# Patient Record
Sex: Male | Born: 1990 | Hispanic: Yes | Marital: Single | Smoking: Never smoker
Health system: Southern US, Community
[De-identification: ages and names within clinical notes are randomized; demographics above are authoritative.]

## PROBLEM LIST (undated history)

## (undated) DIAGNOSIS — U071 COVID-19: Secondary | ICD-10-CM

## (undated) HISTORY — DX: COVID-19: U07.1

---
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
## 2019-01-28 ENCOUNTER — Other Ambulatory Visit: Payer: Self-pay

## 2019-01-28 DIAGNOSIS — Z20822 Contact with and (suspected) exposure to covid-19: Secondary | ICD-10-CM

## 2019-01-30 LAB — NOVEL CORONAVIRUS, NAA: SARS-CoV-2, NAA: NOT DETECTED

## 2019-03-26 ENCOUNTER — Encounter: Payer: Self-pay | Admitting: Urology

## 2019-03-26 ENCOUNTER — Other Ambulatory Visit: Payer: Self-pay

## 2019-03-26 ENCOUNTER — Ambulatory Visit (INDEPENDENT_AMBULATORY_CARE_PROVIDER_SITE_OTHER): Payer: Self-pay | Admitting: Urology

## 2019-03-26 VITALS — BP 125/80 | HR 76 | Ht 64.0 in | Wt 165.0 lb

## 2019-03-26 DIAGNOSIS — N411 Chronic prostatitis: Secondary | ICD-10-CM

## 2019-03-26 DIAGNOSIS — R1084 Generalized abdominal pain: Secondary | ICD-10-CM

## 2019-03-26 DIAGNOSIS — N419 Inflammatory disease of prostate, unspecified: Secondary | ICD-10-CM

## 2019-03-26 DIAGNOSIS — G894 Chronic pain syndrome: Secondary | ICD-10-CM

## 2019-03-26 LAB — URINALYSIS, COMPLETE
Bilirubin, UA: NEGATIVE
Glucose, UA: NEGATIVE
Ketones, UA: NEGATIVE
Leukocytes,UA: NEGATIVE
Nitrite, UA: NEGATIVE
RBC, UA: NEGATIVE
Specific Gravity, UA: 1.025 (ref 1.005–1.030)
Urobilinogen, Ur: 2 mg/dL — ABNORMAL HIGH (ref 0.2–1.0)
pH, UA: 6.5 (ref 5.0–7.5)

## 2019-03-26 LAB — MICROSCOPIC EXAMINATION
Bacteria, UA: NONE SEEN
RBC, Urine: NONE SEEN /hpf (ref 0–2)

## 2019-03-26 LAB — BLADDER SCAN AMB NON-IMAGING: Scan Result: 0

## 2019-03-28 ENCOUNTER — Encounter: Payer: Self-pay | Admitting: Urology

## 2019-03-28 NOTE — Progress Notes (Signed)
03/26/2019 7:12 AM   Rhona Leavens Hernandez [birth year] [id number]  Referring provider: Lorn Junes, FNP [address] [place],  [place] [address]  Chief Complaint  Patient presents with  . Prostatitis    HPI: Preston Mcgee is a 29 y.o. male seen in consultation at the request of Lorenda Cahill, FNP for chronic pelvic/abdominal pain and lower urinary tract symptoms.  He complains of an approximately 1 year history of generalized abdominal discomfort and pelvic pain.  He has low back pain and complains of urinary frequency, urgency and decreased stream.  He has radiation of the pain to his groin and down the inner aspects of his thighs bilaterally.  He also complains of difficulty achieving and maintaining erection and discoloration of his semen but not hematospermia.  He states he has seen 4 different providers and his symptoms have not improved despite treatment.  He complains of significant frustration.  He did see Dr. Evelene Croon and was treated with a medication though he does not recall the specific name.  He has been treated with Cipro at least twice the last a 30-day course without improvement in his symptoms.  IPSS completed today was 6/35 with a quality of life rated 6/6.   PMH: History reviewed. No pertinent past medical history.  Surgical History: History reviewed. No pertinent surgical history.  Home Medications:  Allergies as of 03/26/2019   No Known Allergies     Medication List    as of March 26, 2019 11:59 PM   You have not been prescribed any medications.     Allergies: No Known Allergies  Family History: History reviewed. No pertinent family history.  Social History:  reports that he has never smoked. He has never used smokeless tobacco. He reports current alcohol use. He reports that he does not use drugs.  ROS: UROLOGY Frequent Urination?: Yes Hard to postpone urination?: Yes Burning/pain with urination?: No Get up at night to  urinate?: No Leakage of urine?: No Urine stream starts and stops?: No Trouble starting stream?: Yes Do you have to strain to urinate?: Yes Blood in urine?: No Urinary tract infection?: No Sexually transmitted disease?: No Injury to kidneys or bladder?: No Painful intercourse?: Yes Weak stream?: No Erection problems?: Yes Penile pain?: Yes  Gastrointestinal Nausea?: No Vomiting?: No Indigestion/heartburn?: Yes Diarrhea?: No Constipation?: Yes  Constitutional Fever: No Night sweats?: No Weight loss?: No Fatigue?: No  Skin Skin rash/lesions?: No Itching?: No  Eyes Blurred vision?: No Double vision?: No  Ears/Nose/Throat Sore throat?: No Sinus problems?: No  Hematologic/Lymphatic Swollen glands?: No Easy bruising?: No  Cardiovascular Leg swelling?: No Chest pain?: No  Respiratory Cough?: No Shortness of breath?: No  Endocrine Excessive thirst?: No  Musculoskeletal Back pain?: Yes Joint pain?: No  Neurological Headaches?: No Dizziness?: No  Psychologic Depression?: No Anxiety?: No  Physical Exam: BP 125/80   Pulse 76   Ht 5\' 4"  (1.626 m)   Wt 165 lb (74.8 kg)   BMI 28.32 kg/m   Constitutional:  Alert and oriented, No acute distress. HEENT: [place] AT, moist mucus membranes.  Trachea midline, no masses. Cardiovascular: No clubbing, cyanosis, or edema. Respiratory: Normal respiratory effort, no increased work of breathing. GI: Abdomen is soft, nontender, nondistended, no abdominal masses GU: GU phallus without lesions, testes descended bilaterally without masses or tenderness.  Prostate 25 g with marked tenderness and significant tenderness of the pelvic floor. Skin: No rashes, bruises or suspicious lesions. Neurologic: Grossly intact, no focal deficits, moving all 4 extremities.  Psychiatric: Normal mood and affect.  Laboratory Data:  Urinalysis Dipstick/microscopy negative   Assessment & Plan:   29 y.o. male with chronic pelvic, abdominal  and back pain.  He complains of the ED and bothersome lower urinary tract symptoms.  Significant prostatic and pelvic floor tenderness on exam.  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  His urinalysis have been negative and would avoid any further treatment with antibiotics as the etiology of this condition is not infectious.  I had a frank discussion with him that the etiology is unknown therefore treatment can be difficult.  Due to the chronicity and lack of improvement in his symptoms over the last 12 months I have recommended further evaluation with a CT of the abdomen pelvis and cystoscopy.  We will have our staff contact PCP office to see if he has been tried on tamsulosin and will also request his records from Dr. Yves Dill.  A [place] interpreter was utilized during this visit.    Abbie Sons, [place] [address] [place], [place] [address] [phone]

## 2019-04-01 ENCOUNTER — Telehealth: Payer: Self-pay | Admitting: Urology

## 2019-04-01 NOTE — Telephone Encounter (Signed)
Patient called and he  ask if he could get a women pregnant with his issue. I advised him, yes. he could get a women pregnant .

## 2019-04-01 NOTE — Telephone Encounter (Signed)
Pt called asking to speak to Dr. Lonna Cobb, states he has a question for him.  Pt refused to give details (multiple attempts) says it is personal. Please advise pt at [phone].

## 2019-04-16 ENCOUNTER — Other Ambulatory Visit: Payer: Self-pay

## 2019-04-16 ENCOUNTER — Encounter: Payer: Self-pay | Admitting: Urology

## 2019-04-16 ENCOUNTER — Ambulatory Visit (INDEPENDENT_AMBULATORY_CARE_PROVIDER_SITE_OTHER): Payer: Self-pay | Admitting: Urology

## 2019-04-16 VITALS — BP 123/78 | HR 69 | Ht 64.0 in | Wt 170.0 lb

## 2019-04-16 DIAGNOSIS — R1084 Generalized abdominal pain: Secondary | ICD-10-CM

## 2019-04-16 DIAGNOSIS — N411 Chronic prostatitis: Secondary | ICD-10-CM

## 2019-04-16 DIAGNOSIS — G894 Chronic pain syndrome: Secondary | ICD-10-CM

## 2019-04-16 LAB — MICROSCOPIC EXAMINATION
Bacteria, UA: NONE SEEN
RBC, Urine: NONE SEEN /hpf (ref 0–2)

## 2019-04-16 LAB — URINALYSIS, COMPLETE
Bilirubin, UA: NEGATIVE
Glucose, UA: NEGATIVE
Ketones, UA: NEGATIVE
Leukocytes,UA: NEGATIVE
Nitrite, UA: NEGATIVE
Protein,UA: NEGATIVE
RBC, UA: NEGATIVE
Specific Gravity, UA: >1.03 — ABNORMAL HIGH (ref 1.005–1.030)
Urobilinogen, Ur: 0.2 mg/dL (ref 0.2–1.0)
pH, UA: 5 (ref 5.0–7.5)

## 2019-04-16 MED ORDER — AMITRIPTYLINE HCL 25 MG PO TABS: 25.0000 mg | ORAL_TABLET | Freq: Every day | ORAL | 0 refills | Status: DC

## 2019-04-16 NOTE — Progress Notes (Signed)
Yes ma'am  04/16/19  CC:  Chief Complaint  Patient presents with  . Cysto    HPI: Refer to my previous 03/26/2019.  CT has not been scheduled.  Blood pressure 123/78, pulse 69, height 5\' 4"  (1.626 m), weight 170 lb (77.1 kg).   Cystoscopy Procedure Note  Patient identification was confirmed, informed consent was obtained, and patient was prepped using Betadine solution.  Lidocaine jelly was administered per urethral meatus.     Pre-Procedure: - Inspection reveals a normal caliber ureteral meatus.  Procedure: The flexible cystoscope was introduced without difficulty - No urethral strictures/lesions are present. - Nonocclusive prostate  - Normal bladder neck - Bilateral ureteral orifices identified - Bladder mucosa  reveals no ulcers, tumors, or lesions - No bladder stones - No trabeculation  Retroflexion shows no abnormalities   Post-Procedure: - Patient tolerated the procedure well  Assessment/ Plan: -No evidence urethral stricture. -Will check on status of CT -Trial amitriptyline 25 mg at bedtime   , MD

## 2019-04-18 ENCOUNTER — Encounter: Payer: Self-pay | Admitting: Urology

## 2019-04-30 ENCOUNTER — Other Ambulatory Visit: Payer: Self-pay

## 2019-04-30 ENCOUNTER — Ambulatory Visit
Admission: RE | Admit: 2019-04-30 | Discharge: 2019-04-30 | Disposition: A | Discharge status: Home / Self Care | Payer: Self-pay | Source: Ambulatory Visit | Attending: Urology | Admitting: Urology

## 2019-04-30 DIAGNOSIS — R1084 Generalized abdominal pain: Secondary | ICD-10-CM | POA: Insufficient documentation

## 2019-04-30 DIAGNOSIS — G894 Chronic pain syndrome: Secondary | ICD-10-CM | POA: Insufficient documentation

## 2019-04-30 DIAGNOSIS — N411 Chronic prostatitis: Secondary | ICD-10-CM | POA: Insufficient documentation

## 2019-04-30 MED ORDER — IOHEXOL 300 MG/ML  SOLN
100.0000 mL | Freq: Once | INTRAMUSCULAR | Status: AC | PRN
  Administered 2019-04-30: 100 mL via INTRAVENOUS

## 2019-05-05 ENCOUNTER — Telehealth: Payer: Self-pay | Admitting: *Deleted

## 2019-05-05 NOTE — Telephone Encounter (Signed)
-----   Message from Riki Altes, MD sent at 05/04/2019  1:23 PM EST ----- CT showed no abnormalities.  If amitriptyline is not effective would recommend physical therapy referral for pelvic floor therapy

## 2019-05-06 ENCOUNTER — Encounter: Payer: Self-pay | Admitting: *Deleted

## 2019-05-06 NOTE — Telephone Encounter (Signed)
Sent a my chart message . No vocal mail on phone

## 2019-05-06 NOTE — Telephone Encounter (Signed)
Patient notified, he states he will call back if he wishes to see pelvic floor therapy

## 2019-11-18 ENCOUNTER — Telehealth: Payer: Self-pay | Admitting: Urology

## 2019-11-18 DIAGNOSIS — R102 Pelvic and perineal pain: Secondary | ICD-10-CM

## 2019-11-18 NOTE — Telephone Encounter (Signed)
The last chart entry indicated he would call back if he desired to pursue.  I placed the order

## 2019-11-18 NOTE — Telephone Encounter (Signed)
Patient was calling asking about his referral to PT. I did not see a referral in for one

## 2019-11-19 NOTE — Telephone Encounter (Signed)
OK thank you 

## 2020-01-13 ENCOUNTER — Ambulatory Visit: Payer: 59 | Attending: Urology | Admitting: Physical Therapy

## 2020-01-13 ENCOUNTER — Other Ambulatory Visit: Payer: Self-pay

## 2020-01-13 ENCOUNTER — Encounter: Payer: Self-pay | Admitting: Physical Therapy

## 2020-01-13 DIAGNOSIS — M6208 Separation of muscle (nontraumatic), other site: Secondary | ICD-10-CM | POA: Diagnosis present

## 2020-01-13 DIAGNOSIS — M6281 Muscle weakness (generalized): Secondary | ICD-10-CM | POA: Diagnosis present

## 2020-01-13 DIAGNOSIS — M533 Sacrococcygeal disorders, not elsewhere classified: Secondary | ICD-10-CM | POA: Insufficient documentation

## 2020-01-13 DIAGNOSIS — M791 Myalgia, unspecified site: Secondary | ICD-10-CM | POA: Insufficient documentation

## 2020-01-13 NOTE — Patient Instructions (Addendum)
    Avoid straining pelvic floor, abdominal muscles , spine  Use log rolling technique instead of getting out of bed with your neck or the sit-up     Log rolling into and out of bed   Log rolling into and out of bed If getting out of bed on R side, Bent knees, scoot hips/ shoulder to L  Raise R arm completely overhead, rolling onto armpit  Then lower bent knees to bed to get into complete side lying position  Then drop legs off bed, and push up onto R elbow/forearm, and use L hand to push onto the bed   __  Avoid slumped / leaning to one hip when driving   Sit with feet on the ground  ___

## 2020-01-14 NOTE — Therapy (Addendum)
[facility] MAIN [facility] SERVICES [address] [place], [place], [address] Phone: [phone]   Fax:  [phone]  Physical Therapy Evaluation  Patient Details  Name: Preston Mcgee MRN: [id number] Date of Birth: 04-22-1990 Referring Provider (PT): Stoiff    Encounter Date: 01/13/2020    History reviewed. No pertinent past medical history.  No past surgical history on file.  There were no vitals filed for this visit.    Subjective Assessment - 01/13/20 1510    Subjective 1) Pelvic and LBP pain: Pt reports this Sx started 2 years ago at the R testicle. Eventually the pain went to both testicle, scrotum, and down inside of legs to knees. Pt was prescribed pills prostatitis but they did not help.  Six months ago the first visit ( 1.5 years ago), pt went to health department because the pain increased and moving to low back area and kidney area.  Currently the LBP is limiting bending forward, bowel movements. Pain has limited pt from performing his physical workout. Testicular pain occurs with LBP sometimes with bending forward. Overall the LBP   8-9/10.  Testicular pain 10/10. R is tender to touch.      2)  Painful ejaculation with foul smell of ejaculation and difficulty with erection. Pt spoke with the doctor and his prostate was checked out. Cystoscope findings were neg.     3) painful urination and with bowel movement. Daily fluid intake: 1 gallon of water, Pepsi  1 can per week, denied tea and coffee no alcohol.  Frequency of BM every other day over the past 2 years. Prior to 2 years ago, pt had daily BMs. Pt also started to have to push.  Type 4 25%, Stool Type 3  75% of the time.   4) bloating started across the past 2 years.      Pertinent History Past physical workout: run, jog, soccer, performed sit ups/ crunches. 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     Patient Stated Goals address LBP, erection/ ejaculation issues, and having bowel movements / urination without pain              [facility] PT Assessment - 01/26/20 0925      Assessment   Medical Diagnosis Pelvic dysfunction    Referring Provider (PT) Stoiff       Precautions   Precautions None      Restrictions   Weight Bearing Restrictions No      Balance Screen   Has the patient fallen in the past 6 months No      Palpation   Spinal mobility L sidebend ~10% limited by pain, R rotation/ sideflexion no pain ( post Tx: improved AROM ~40%)      SI assessment  L iliac crest/ patella  higher, ( post Tx: levelled iliac crest)      Palpation comment palpation at pubic symphysis with referred pain to low back  ( post Tx: no pain with palpation at pubic symphysis)                 Pelvic Floor Special Questions - 01/26/20 0939    Diastasis Recti 3 fingers width               [facility] Adult PT Treatment/Exercise - 01/26/20 0925      Bed Mobility   Bed Mobility --   sit -> long sit into bed with  pain      Posture/Postural Control   Posture Comments limited diaphragmatic breathing       Therapeutic Activites    Therapeutic Activities --   explained anatomy/ physiology, body mechanics     Neuro Re-ed    Neuro Re-ed Details  cued for HEP       Manual Therapy   Manual therapy comments long axis distraction, rotational mob at T/L junction                       PT Long Term Goals - 01/13/20 1529      PT LONG TERM GOAL #1   Title Pt will report decreased pain with urination and bowel movement by 50% in order to improve QOL  and ADLs    Time 4    Period Weeks    Status New    Target Date 02/10/20      PT LONG TERM GOAL #2   Title Pt will report improved stool consistency with Type 4 25% to > 75% of the time in order have less straining    Time 8    Period Weeks    Status New    Target Date 03/09/20      PT LONG TERM GOAL #3   Title Pt will decrease  FOTO score for Pelvic Pain  and PFDI Urinary from 67 pts to < 57 pts in order to improve pelvic function    Time 10    Period Weeks    Status New    Target Date 03/23/20      PT LONG TERM GOAL #4   Title Pt will demo proper body mechanics to minimize straining abdominopelvic regions and improved posture ( less slumped) and 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  in order to optimize IAP system    Time 6    Period Weeks    Status New    Target Date 02/23/20      PT LONG TERM GOAL #5   Title Pt will report decreased pain by 50% with ejaculation, urination, and with bowel movements in order to improve ADLs    Time 6    Period Weeks    Status New    Target Date 02/23/20      Additional Long Term Goals   Additional Long Term Goals --      PT LONG TERM GOAL #6   Title Pt will report less bloating sensation by 50% and demo proper techniques for modified ab strengthening exercises  in order to promote less strain at abdominopelvic region while performing fitness routines.    Time 4    Period Weeks    Status New    Target Date 02/09/20      PT LONG TERM GOAL #7   Title Pt will demo decreased abdominal separation from 3 fingers width to < 2 fingers width in order to improve IAP system/ postural support to progress to proper abdominal and fitness exercises    Time 8    Period Weeks    Status New    Target Date 03/08/20      PT LONG TERM GOAL #8   Title Pt will demo equal alignment of pelvic girdle across 2 sessions and no referred pain to LBP area with palpation at pubic symphysis across 2 sessions in order for pt to bend forward with less pain    Time 4    Period Weeks  Status New    Target Date 02/09/20                  Plan - 01/26/20 0973    Clinical Impression Statement  Pt is a  29  yo who presents with pelvic pain, painful ejaculation, urination, bowel movements, and bloating. These Sx impact his ADLs and QOL. Pt's musculoskeletal assessment  revealed abdominal separation, limited spinal /pelvic mobility, referred LBP with palpation at pubic symphysis,  dyscoordination and strength of pelvic floor mm, poor posture and body mechanics which places strain on the abdominal/pelvic floor mm. These are deficits that indicate an ineffective intraabdominal pressure system associated with increased risk for pt's Sx.   Pt has a Hx of performing heavy lifting at a previous job and currently has a sedentary job as Naval architect. Advised pt to avoid leaning onto one side of pelvis when driving in order to minimize pelvic obliquities. Pt also had a Hx of performing sit-ups/ crunches in the past. Advised pt to not perform sit-ups and crunches as these movement patterns lead to more downward forces on pelvic floor, negatively impacting abdominopelvic/spinal dysfunctions. Pt will benefit from proper coordination training and education on fitness and functional positions in order to yield greater outcomes.   Pt was provided education on etiology of Sx with anatomy, physiology explanation with images along with the benefits of customized pelvic PT Tx based on pt's medical conditions and musculoskeletal deficits.  Explained the physiology of deep core mm coordination and roles of pelvic floor function in urination, defecation, sexual function, and postural control with deep core mm system.   Following Tx today which pt tolerated without complaints, pt demo'd equal alignment of pelvic girdle,   increased spinal mobility, and no more referred pain to low back with palpation at pubic symphysis.Plan to initiate deep core coordination address diastasis recti at next session and perform pelvic floor assessment more in-dept at upcoming sessions.   Engineer, structural required.      Personal Factors and Comorbidities Fitness    Examination-Activity Limitations Squat;Lift;Bend;Toileting    Stability/Clinical Decision Making Evolving/Moderate complexity    Rehab Potential  Good    PT Frequency 1x / week    PT Duration Other (comment)   10   PT Treatment/Interventions Balance training;Therapeutic exercise;Neuromuscular re-education;Therapeutic activities;Moist Heat;Manual techniques;Taping;Patient/family education;Functional mobility training;Gait training;Dry needling    Consulted and Agree with Plan of Care Patient           Patient will benefit from skilled therapeutic intervention in order to improve the following deficits and impairments:  Decreased safety awareness, Decreased coordination, Decreased range of motion, Difficulty walking, Decreased endurance, Decreased balance, Decreased activity tolerance, Improper body mechanics, Increased muscle spasms, Postural dysfunction, Pain, Decreased mobility  Visit Diagnosis: Sacrococcygeal disorders, not elsewhere classified  Muscle weakness (generalized)  Diastasis recti  Myalgia     Problem List There are no problems to display for this patient.   Mariane Masters 01/26/2020, 9:50 AM  [place] [facility] MAIN [facility] SERVICES [address] [place], [place], [address] Phone: [phone]   Fax:  [phone]  Name: Darly Fails MRN: [id number] Date of Birth: 1991/01/25

## 2020-01-22 ENCOUNTER — Encounter: Payer: Self-pay | Admitting: Physical Therapy

## 2020-01-26 NOTE — Addendum Note (Signed)
Addended by: Mariane Masters on: 01/26/2020 09:52 AM   Modules accepted: Orders

## 2020-02-02 ENCOUNTER — Other Ambulatory Visit: Payer: Self-pay

## 2020-02-02 ENCOUNTER — Ambulatory Visit: Payer: 59 | Admitting: Physical Therapy

## 2020-02-02 DIAGNOSIS — M6281 Muscle weakness (generalized): Secondary | ICD-10-CM

## 2020-02-02 DIAGNOSIS — M533 Sacrococcygeal disorders, not elsewhere classified: Secondary | ICD-10-CM

## 2020-02-02 DIAGNOSIS — M6208 Separation of muscle (nontraumatic), other site: Secondary | ICD-10-CM

## 2020-02-02 DIAGNOSIS — M791 Myalgia, unspecified site: Secondary | ICD-10-CM

## 2020-02-02 NOTE — Therapy (Signed)
[facility] MAIN [facility] SERVICES [address] [place], [place], [address] Phone: [phone]   Fax:  [phone]  Physical Therapy Treatment  Patient Details  Name: Preston Mcgee MRN: [id number] Date of Birth: 02-17-1991 Referring Provider (PT): Stoiff    Encounter Date: 02/02/2020   PT End of Session - 02/02/20 0200    Visit Number 2    Number of Visits 10    Date for PT Re-Evaluation 03/25/20    PT Start Time 1408    PT Stop Time 1456    PT Time Calculation (min) 48 min           No past medical history on file.  No past surgical history on file.  There were no vitals filed for this visit.   Subjective Assessment - 02/02/20 1411    Subjective Pt  reported he felt better w ith no more LBP when standing  but the pain remained the same with urination and bowel movements. Daily fluid intake 1 gallon of tea, 1 cup tea    Patient is accompained by: Interpreter    Pertinent History Past physical workout: run, jog, soccer, performed sit ups/ crunches. 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    Patient Stated Goals address LBP, erection/ ejaculation issues, and having bowel movements / urination without pain              [facility] PT Assessment - 02/02/20 1416      Palpation   Spinal mobility tightness at paraspinal mm lumbar and thoracic     SI assessment  L iliac crest/ patella levelled     Palpation comment tenderness at pubic symphysis B, Tightness paraspinal B ,                        Pelvic Floor Special Questions - 02/02/20 1416    Diastasis Recti no separation     External Perineal Exam Through undergarments ,  tightenss/ tenderness at bulbospongiosus, perineal traverse, B               [facility] Adult PT Treatment/Exercise - 02/02/20 1517      Neuro Re-ed    Neuro Re-ed Details  cued for HEP       Manual Therapy   Manual therapy  comments L rotational mob to promote nutation of sacrum, paraspinals B, ischial tuberosity attachments B                          PT Long Term Goals - 01/13/20 1529      PT LONG TERM GOAL #1   Title Pt will report decreased pain with urination and bowel movement by 50% in order to improve QOL  and ADLs    Time 4    Period Weeks    Status New    Target Date 02/10/20      PT LONG TERM GOAL #2   Title Pt will report improved stool consistency with Type 4 25% to > 75% of the time in order have less straining    Time 8    Period Weeks    Status New    Target Date 03/09/20      PT LONG TERM GOAL #3   Title Pt will decrease FOTO score for Pelvic Pain  and PFDI Urinary from 67 pts to < 57 pts  in order to improve pelvic function    Time 10    Period Weeks    Status New    Target Date 03/23/20      PT LONG TERM GOAL #4   Title Pt will demo proper body mechanics to minimize straining abdominopelvic regions and improved posture ( less slumped) and 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  in order to optimize IAP system    Time 6    Period Weeks    Status New    Target Date 02/23/20      PT LONG TERM GOAL #5   Title Pt will report decreased pain by 50% with ejaculation, urination, and with bowel movements in order to improve ADLs    Time 6    Period Weeks    Status New    Target Date 02/23/20      Additional Long Term Goals   Additional Long Term Goals --      PT LONG TERM GOAL #6   Title Pt will report less bloating sensation by 50% and demo proper techniques for modified ab strengthening exercises  in order to promote less strain at abdominopelvic region while performing fitness routines.    Time 4    Period Weeks    Status New    Target Date 02/09/20      PT LONG TERM GOAL #7   Title Pt will demo decreased abdominal separation from 3 fingers width to < 2 fingers width in order to improve IAP system/ postural support to progress to proper  abdominal and fitness exercises    Time 8    Period Weeks    Status New    Target Date 03/08/20      PT LONG TERM GOAL #8   Title Pt will demo equal alignment of pelvic girdle across 2 sessions and no referred pain to LBP area with palpation at pubic symphysis across 2 sessions in order for pt to bend forward with less pain    Time 4    Period Weeks    Status New    Target Date 02/09/20                 Plan - 02/02/20 1502    Clinical Impression Statement Pt showed good carry over from last session with levelled pelvic girdle and resolved DRA. Pt reported LBP is better as he is able to stand for longer periods of time. External pelvic floor assessment showed tightness of pelvic floor mm and referred pain to LBP. Thus, addressed tightness of paraspinal mm and deferred pelvic floor HEP for next session. HEP today was focused decreasing paraspinal mm tightness. Provided education on modifying his gym routine in upcoming sessions so pt can return to the gym.  Explained the importance of flexibility to compliment strength training. Pt continues to benefit from skilled PT.    Personal Factors and Comorbidities Fitness    Examination-Activity Limitations Squat;Lift;Bend;Toileting    Stability/Clinical Decision Making Evolving/Moderate complexity    Rehab Potential Good    PT Frequency 1x / week    PT Duration Other (comment)   10   PT Treatment/Interventions Balance training;Therapeutic exercise;Neuromuscular re-education;Therapeutic activities;Moist Heat;Manual techniques;Taping;Patient/family education;Functional mobility training;Gait training;Dry needling    Consulted and Agree with Plan of Care Patient           Patient will benefit from skilled therapeutic intervention in order to improve the following deficits and impairments:  Decreased safety awareness, Decreased  coordination, Decreased range of motion, Difficulty walking, Decreased endurance, Decreased balance, Decreased  activity tolerance, Improper body mechanics, Increased muscle spasms, Postural dysfunction, Pain, Decreased mobility  Visit Diagnosis: Muscle weakness (generalized)  Diastasis recti  Myalgia  Sacrococcygeal disorders, not elsewhere classified     Problem List There are no problems to display for this patient.   Mariane Masters ,PT, DPT, E-RYT  02/02/2020, 3:21 PM  [place] [facility] MAIN [facility] SERVICES [address] [place], [place], [address] Phone: [phone]   Fax:  [phone]  Name: Preston Mcgee MRN: [id number] Date of Birth: Feb 15, 1991

## 2020-02-09 ENCOUNTER — Other Ambulatory Visit: Payer: Self-pay

## 2020-02-09 ENCOUNTER — Ambulatory Visit: Payer: 59 | Attending: Urology | Admitting: Physical Therapy

## 2020-02-09 DIAGNOSIS — M533 Sacrococcygeal disorders, not elsewhere classified: Secondary | ICD-10-CM | POA: Diagnosis present

## 2020-02-09 DIAGNOSIS — M6281 Muscle weakness (generalized): Secondary | ICD-10-CM | POA: Diagnosis not present

## 2020-02-09 DIAGNOSIS — M791 Myalgia, unspecified site: Secondary | ICD-10-CM

## 2020-02-09 DIAGNOSIS — M6208 Separation of muscle (nontraumatic), other site: Secondary | ICD-10-CM | POA: Diagnosis present

## 2020-02-09 NOTE — Patient Instructions (Addendum)
Minisquat,   interlace hands, rise up, chest lifts , shoulders squeeze together    5 reps  __  Stretches : (Cuing provided for proper alignment)   With strap under thigh   _ figure 4    _ cross thigh over     - scoot hip over to R, drop R knee over, L knee straight     -Quad in sidelying _strap around the ankle, pulling ankle towards buttocks

## 2020-02-09 NOTE — Therapy (Addendum)
[facility] MAIN [facility] SERVICES [address] [place], [place], [address] Phone: [phone]   Fax:  [phone]  Physical Therapy Treatment  Patient Details  Name: Preston Mcgee MRN: [id number] Date of Birth: 1990/10/16 Referring Provider (PT): Stoiff    Encounter Date: 02/09/2020   PT End of Session - 02/09/20 1420    Visit Number 3    Number of Visits 10    Date for PT Re-Evaluation 03/25/20    PT Start Time 1409    PT Stop Time 1505    PT Time Calculation (min) 56 min    Activity Tolerance No increased pain;Patient tolerated treatment well    Behavior During Therapy [facility] for tasks assessed/performed           No past medical history on file.  No past surgical history on file.  There were no vitals filed for this visit.   Subjective Assessment - 02/09/20 1412    Subjective Pt's  low back area feels lighter. Two weeks ago with use of stool to prop his legs up during BMs, pt was able to have BMs daily for one week. Last week, BMs occurred every other day.    Patient is accompained by: Interpreter    Pertinent History Past physical workout: run, jog, soccer, performed sit ups/ crunches. 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    Patient Stated Goals address LBP, erection/ ejaculation issues, and having bowel movements / urination without pain              [facility] PT Assessment - 02/09/20 1414      Coordination   Gross Motor Movements are Fluid and Coordinated --   chest breathing. upward movement of pelvic floor      Palpation   Spinal mobility sidebend/ rotation WFL, no pain     SI assessment  levelled iliac crest B     Palpation comment L FADDIR/ hip IR tighter than R, ITband/ thoracolumbar tightness                        Pelvic Floor Special Questions - 02/09/20 1423    External Perineal Exam 2nd -3rd layers tightness perineal  tranverse mm              [facility] Adult PT Treatment/Exercise - 02/09/20 1458      Neuro Re-ed    Neuro Re-ed Details  cued for technique for BLE / hip/ SIJ stretches and deep core 1 and 2    cued for less chest breathing      Exercises   Exercises --   see pt instructions,explained w/ anatomy pic,how helps goals     Manual Therapy   Manual therapy comments  long axis distraction at LLE, QL to promote FADDIR and SIJ mobility                        PT Long Term Goals - 01/13/20 1529      PT LONG TERM GOAL #1   Title Pt will report decreased pain with urination and bowel movement by 50% in order to improve QOL  and ADLs    Time 4    Period Weeks    Status New    Target Date 02/10/20      PT LONG TERM GOAL #2   Title Pt will report improved stool  consistency with Type 4 25% to > 75% of the time in order have less straining    Time 8    Period Weeks    Status New    Target Date 03/09/20      PT LONG TERM GOAL #3   Title Pt will decrease FOTO score for Pelvic Pain  and PFDI Urinary from 67 pts to < 57 pts in order to improve pelvic function    Time 10    Period Weeks    Status New    Target Date 03/23/20      PT LONG TERM GOAL #4   Title Pt will demo proper body mechanics to minimize straining abdominopelvic regions and improved posture ( less slumped) and 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  in order to optimize IAP system    Time 6    Period Weeks    Status New    Target Date 02/23/20      PT LONG TERM GOAL #5   Title Pt will report decreased pain by 50% with ejaculation, urination, and with bowel movements in order to improve ADLs    Time 6    Period Weeks    Status New    Target Date 02/23/20      Additional Long Term Goals   Additional Long Term Goals --      PT LONG TERM GOAL #6   Title Pt will report less bloating sensation by 50% and demo proper techniques for modified ab strengthening exercises  in order to promote less  strain at abdominopelvic region while performing fitness routines.    Time 4    Period Weeks    Status New    Target Date 02/09/20      PT LONG TERM GOAL #7   Title Pt will demo decreased abdominal separation from 3 fingers width to < 2 fingers width in order to improve IAP system/ postural support to progress to proper abdominal and fitness exercises    Time 8    Period Weeks    Status New    Target Date 03/08/20      PT LONG TERM GOAL #8   Title Pt will demo equal alignment of pelvic girdle across 2 sessions and no referred pain to LBP area with palpation at pubic symphysis across 2 sessions in order for pt to bend forward with less pain    Time 4    Period Weeks    Status New    Target Date 02/09/20                 Plan - 02/09/20 1421    Clinical Impression Statement Pt demo'd improved mobility at spine and has maintained levelled iliac crest alignment across the past sessions and resolved DRA which is helping with improved IAP system and consistent with improved LBP.  Further addressed SIJ/ hip tightness with manual Tx which helped to improve hip mobility.  Educated pt on hip stretches. Progressed to deep core exercises whic pt required minor cues for less chest breathing/ dyscoordination.  Pt demo'd less pelvic floor tenderness and tightness after hip stretches. Pt continues to benefit from skilled PT    Personal Factors and Comorbidities Fitness    Examination-Activity Limitations Squat;Lift;Bend;Toileting    Stability/Clinical Decision Making Evolving/Moderate complexity    Rehab Potential Good    PT Frequency 1x / week    PT Duration Other (comment)   10   PT Treatment/Interventions  Balance training;Therapeutic exercise;Neuromuscular re-education;Therapeutic activities;Moist Heat;Manual techniques;Taping;Patient/family education;Functional mobility training;Gait training;Dry needling    Consulted and Agree with Plan of Care Patient           Patient will benefit  from skilled therapeutic intervention in order to improve the following deficits and impairments:  Decreased safety awareness, Decreased coordination, Decreased range of motion, Difficulty walking, Decreased endurance, Decreased balance, Decreased activity tolerance, Improper body mechanics, Increased muscle spasms, Postural dysfunction, Pain, Decreased mobility  Visit Diagnosis: Muscle weakness (generalized)  Diastasis recti  Myalgia  Sacrococcygeal disorders, not elsewhere classified     Problem List There are no problems to display for this patient.   Mariane Masters ,PT, DPT, E-RYT  02/09/2020, 3:00 PM  [facility] MAIN [facility] SERVICES [address] [place], [place], [address] Phone: [phone]   Fax:  [phone]  Name: Preston Mcgee MRN: [id number] Date of Birth: [birth year]

## 2020-02-16 ENCOUNTER — Other Ambulatory Visit: Payer: Self-pay

## 2020-02-16 ENCOUNTER — Ambulatory Visit: Payer: 59 | Admitting: Physical Therapy

## 2020-02-16 DIAGNOSIS — M6208 Separation of muscle (nontraumatic), other site: Secondary | ICD-10-CM

## 2020-02-16 DIAGNOSIS — M533 Sacrococcygeal disorders, not elsewhere classified: Secondary | ICD-10-CM

## 2020-02-16 DIAGNOSIS — M6281 Muscle weakness (generalized): Secondary | ICD-10-CM

## 2020-02-16 DIAGNOSIS — M791 Myalgia, unspecified site: Secondary | ICD-10-CM

## 2020-02-16 NOTE — Therapy (Signed)
[facility] MAIN [facility] SERVICES [address] [place], [place], [address] Phone: [phone]   Fax:  [phone]  Physical Therapy Treatment  Patient Details  Name: Preston Mcgee MRN: [id number] Date of Birth: 1990/04/12 Referring Provider (PT): Stoiff    Encounter Date: 02/16/2020   PT End of Session - 02/16/20 1501    Visit Number 4    Number of Visits 10    Date for PT Re-Evaluation 03/25/20    PT Start Time 1405    PT Stop Time 1500    PT Time Calculation (min) 55 min    Activity Tolerance No increased pain;Patient tolerated treatment well    Behavior During Therapy [facility] for tasks assessed/performed           No past medical history on file.  No past surgical history on file.  There were no vitals filed for this visit.   Subjective Assessment - 02/16/20 1408    Subjective Pt is able to empty bowels more completely except for 2 x last week and still continue to have daily bowel movements. Pt is feeling pain in low back that radiates down the leg when he bends. While driving his trunk,  burning in low abdomen when delaying his urine by 30 min after feeling the urge to go.  After urinating the burning continues.    Patient is accompained by: Interpreter    Pertinent History Past physical workout: run, jog, soccer, performed sit ups/ crunches. 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    Patient Stated Goals address LBP, erection/ ejaculation issues, and having bowel movements / urination without pain              [facility] PT Assessment - 02/16/20 1451      Observation/Other Assessments   Observations decreased paraspinal mm      Squat   Comments locked knees      Palpation   Palpation comment hypomobile L SIJ./ sacral spine./ hip abduction system L                         [facility] Adult PT Treatment/Exercise - 02/16/20 1452       Therapeutic Activites    Other Therapeutic Activities explained anatomy/ phsyology and etiology to Sx, provided reassurance to improvement to his Sx, explained water intake and rtaking rest stops to urinate and not to delay urination,      Neuro Re-ed    Neuro Re-ed Details  cued for new stretches for hips/ pelvic floor. back when taking rest stps during long drives, cued for proper squats      Manual Therapy   Manual therapy comments long axis distraction, AP mob at L hip to promote mobility/ FADDIR / mm STM to promote mobility L SIJ                       PT Long Term Goals - 02/16/20 1530      PT LONG TERM GOAL #1   Title Pt will report decreased pain with urination and bowel movement by 50% in order to improve QOL  and ADLs    Time 4    Period Weeks    Status On-going      PT LONG TERM GOAL #2   Title Pt will report improved stool consistency with Type 4 25% to > 75% of the  time in order have less straining    Time 8    Period Weeks    Status On-going      PT LONG TERM GOAL #3   Title Pt will decrease FOTO score for Pelvic Pain  and PFDI Urinary from 67 pts to < 57 pts in order to improve pelvic function    Time 10    Period Weeks    Status On-going      PT LONG TERM GOAL #4   Title Pt will demo proper body mechanics to minimize straining abdominopelvic regions and improved posture ( less slumped) and 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    Time 4    Period Weeks    Status On-going      PT LONG TERM GOAL #7   Title Pt will demo decreased abdominal separation from 3 fingers width to < 2 fingers width in order to improve IAP system/ postural support to progress to proper abdominal and fitness exercises    Time 8    Period Weeks    Status On-going      PT LONG TERM GOAL #8   Title Pt will demo equal alignment of pelvic girdle across 2 sessions and no referred pain to LBP area with palpation at pubic symphysis across 2 sessions in order for pt to bend forward with less pain    Time 4    Period Weeks    Status On-going                 Plan - 02/16/20 1527    Clinical Impression Statement Pt continued to maintain equal pelvic girdle alignment and is showing less paraspinal mm tightness which is consistent with his report of improved bowel movements. Addressed his urinary Sx with education to not delay urination which is pattern he has as he is a [employer]. Advised water intake 2 hours before arriving rest stop to minimize delaying urination. Provided stretches to global hip mm and pelvic floor to perform while taking rest stops during his long drives.  Provided manual Tx to optimize L SIJ mobility which will help minimize radiating pain.  Pt continues to benefit from skilled PT.    Personal Factors and Comorbidities Fitness    Examination-Activity Limitations Squat;Lift;Bend;Toileting    Stability/Clinical Decision Making Evolving/Moderate complexity    Rehab Potential Good    PT Frequency 1x / week    PT Duration Other (comment)   10   PT Treatment/Interventions Balance training;Therapeutic exercise;Neuromuscular re-education;Therapeutic activities;Moist Heat;Manual techniques;Taping;Patient/family education;Functional mobility training;Gait training;Dry needling    Consulted and Agree with Plan of Care Patient           Patient will benefit from skilled therapeutic intervention in order to improve  the following deficits and impairments:  Decreased safety awareness,Decreased  coordination,Decreased range of motion,Difficulty walking,Decreased endurance,Decreased balance,Decreased activity tolerance,Improper body mechanics,Increased muscle spasms,Postural dysfunction,Pain,Decreased mobility  Visit Diagnosis: Muscle weakness (generalized)  Diastasis recti  Myalgia  Sacrococcygeal disorders, not elsewhere classified     Problem List There are no problems to display for this patient.   Mariane Masters ,PT, DPT, E-RYT   02/16/2020, 3:30 PM  [place] [facility] MAIN [facility] SERVICES [address] [place], [place], [address] Phone: [phone]   Fax:  [phone]  Name: Bran Aldridge MRN: [id number] Date of Birth: October 23, 1990

## 2020-02-16 NOTE — Patient Instructions (Signed)
Work stretches at rest stops to stretch  Hip flexor Adductors  gluts  Seated stretches ( twist and figure 4 )   __  anatomy p ictures ( copied ) to explain the role of nerves and relaxed pelvic floor muscles for pelvic functions)   __  water intake 2 hours before arriving rest stop  To minimize delaying urination

## 2020-02-23 ENCOUNTER — Ambulatory Visit: Payer: 59 | Admitting: Physical Therapy

## 2020-02-23 ENCOUNTER — Other Ambulatory Visit: Payer: Self-pay

## 2020-02-23 DIAGNOSIS — M533 Sacrococcygeal disorders, not elsewhere classified: Secondary | ICD-10-CM

## 2020-02-23 DIAGNOSIS — M6281 Muscle weakness (generalized): Secondary | ICD-10-CM

## 2020-02-23 DIAGNOSIS — M791 Myalgia, unspecified site: Secondary | ICD-10-CM

## 2020-02-23 DIAGNOSIS — M6208 Separation of muscle (nontraumatic), other site: Secondary | ICD-10-CM

## 2020-02-23 NOTE — Patient Instructions (Signed)
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

## 2020-02-23 NOTE — Therapy (Signed)
[facility] MAIN [facility] SERVICES [address] [place], [place], [address] Phone: [phone]   Fax:  [phone]  Physical Therapy Treatment  Patient Details  Name: Preston Mcgee MRN: [id number] Date of Birth: 1990/12/02 Referring Provider (PT): Stoiff    Encounter Date: 02/23/2020   PT End of Session - 02/23/20 1638    Visit Number 5    Number of Visits 10    Date for PT Re-Evaluation 03/25/20    PT Start Time 1403    PT Stop Time 1503    PT Time Calculation (min) 60 min    Activity Tolerance No increased pain;Patient tolerated treatment well    Behavior During Therapy [facility] for tasks assessed/performed           No past medical history on file.  No past surgical history on file.  There were no vitals filed for this visit.   Subjective Assessment - 02/23/20 1404    Subjective Pt reports no pelvic pain for one week. Pt has had no  radiating LBP pain last week except 2/10 pain.  Bowel movements occur daily. 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 Today , pt felt burning sensation at pubic area after drinking water and driving his truck. Today his urination smelled strange.  Pt has been treated for infections and he does not have any.  This is the original Sx he had when all these issues started.    Patient is accompained by: Interpreter    Pertinent History Past physical workout: run, jog, soccer, performed sit ups/ crunches. 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    Patient Stated Goals address LBP, erection/ ejaculation issues, and having bowel movements / urination without pain              [facility] PT Assessment - 02/23/20 1408      Observation/Other Assessments   Observations posterir tilt of pelvis in seating , required cues       Squat   Comments poor carry over, no trunk/ hip bend,      Posture/Postural Control   Posture Comments hyperextended knees , simulated truck sitting posture: leaning to R, L shoulder flexion on wheel , slumped sit      Palpation   Palpation comment pain above pubic symphysis R, deep palpation with R SLR ( post Tx, no pain)  fascial restrictions noted with pulling sensation reported                        [facility] Adult PT Treatment/Exercise - 02/23/20 1703      Neuro Re-ed    Neuro Re-ed Details  cued for proper squat ( 10 reps w/ kettlebell), sitting posture in truck to not lean, modify pull up without h ip flexion but to stand instead      Modalities   Modalities Moist Heat      Moist Heat Therapy   Number Minutes Moist Heat 5 Minutes    Moist Heat Location --   in prone w/ stretches     Manual Therapy   Manual therapy comments fascial releases over R LQ by pubic symphysis with lower trunk rotation           Therapeutic Activities : anatomy and  physiology explanations with images             PT Long Term Goals - 02/16/20 1530      PT LONG TERM GOAL #1   Title Pt will report decreased pain with urination and bowel movement by 50% in order to improve QOL  and ADLs    Time 4    Period Weeks    Status On-going      PT LONG TERM GOAL #2   Title Pt will report improved stool consistency with Type 4 25% to > 75% of the time in order have less straining    Time 8    Period Weeks    Status On-going      PT LONG TERM GOAL #3   Title Pt will decrease FOTO score for Pelvic Pain  and PFDI Urinary from 67 pts to < 57 pts in order to improve pelvic function    Time 10    Period Weeks    Status On-going      PT LONG TERM GOAL #4   Title Pt will demo proper body mechanics to minimize straining abdominopelvic regions and improved posture ( less slumped) and 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    Time 4    Period Weeks    Status On-going      PT LONG TERM GOAL #7   Title Pt will demo decreased abdominal separation from 3 fingers width to < 2 fingers width in order to improve IAP system/ postural support to progress to proper abdominal and fitness exercises    Time 8    Period Weeks    Status On-going      PT LONG TERM GOAL #8   Title Pt will demo equal alignment of pelvic girdle across 2 sessions and no referred pain to LBP area with palpation at pubic symphysis across 2 sessions in order for pt to bend forward with less pain    Time 4    Period Weeks    Status On-going                 Plan - 02/23/20 1736    Clinical Impression Statement Pt continues to make improvements with no pelvic pain and LBP last week. Pt also reported having to not delay urination by drinking 2 hours before he stops at rest stops on this truck route instead of drinking consistently.   Today, pt noticed while in his truck sitting after drinking water, he noticed a burning sensation at R LQ suprapubic area. Manual Tx helped to minimize fascial restrictions and pt reported no burning psensation afterwards with deep palpation at lateral border of bladder nor when engaging with R SLR. Required excessive cues for proper squat and avoiding hyperextension of knees to minimize SIJ hypomobility /  pelvic floor tightness. Required education on proper sitting position in truck to avoid R lean and shortening inguinal fascial area. Pt continues  to benefit from skilled PT.    Personal Factors and Comorbidities Fitness    Examination-Activity Limitations Squat;Lift;Bend;Toileting    Stability/Clinical Decision Making Evolving/Moderate complexity    Rehab Potential Good    PT Frequency 1x / week    PT Duration Other (comment)   10   PT Treatment/Interventions Balance training;Therapeutic exercise;Neuromuscular re-education;Therapeutic activities;Moist Heat;Manual techniques;Taping;Patient/family education;Functional mobility training;Gait training;Dry needling    Consulted and Agree with Plan of Care Patient           Patient will benefit from skilled therapeutic intervention in order to improve the following deficits and impairments:  Decreased safety awareness,Decreased coordination,Decreased range of motion,Difficulty walking,Decreased endurance,Decreased balance,Decreased activity tolerance,Improper body mechanics,Increased muscle spasms,Postural dysfunction,Pain,Decreased mobility  Visit Diagnosis: Muscle weakness (generalized)  Diastasis recti  Myalgia  Sacrococcygeal disorders, not elsewhere classified     Problem List There are no problems to display for this patient.   Mariane Masters  ,PT, DPT, E-RYT  02/23/2020, 5:36 PM  [place] [facility] MAIN [facility] SERVICES [address] [place], [place], [address] Phone: [phone]   Fax:  [phone]  Name: Preston Mcgee MRN: [id number] Date of Birth: [birth year]

## 2020-03-01 ENCOUNTER — Ambulatory Visit: Payer: 59 | Admitting: Physical Therapy

## 2020-03-01 ENCOUNTER — Other Ambulatory Visit: Payer: Self-pay

## 2020-03-01 DIAGNOSIS — M6281 Muscle weakness (generalized): Secondary | ICD-10-CM | POA: Diagnosis not present

## 2020-03-01 DIAGNOSIS — M6208 Separation of muscle (nontraumatic), other site: Secondary | ICD-10-CM

## 2020-03-01 DIAGNOSIS — M791 Myalgia, unspecified site: Secondary | ICD-10-CM

## 2020-03-01 DIAGNOSIS — M533 Sacrococcygeal disorders, not elsewhere classified: Secondary | ICD-10-CM

## 2020-03-01 NOTE — Patient Instructions (Addendum)
Add to work stretches after you arrive home     Quad stretch on your belly with pillow under hips   strap at the ankle     Resting on your belly with pillow under hips, 5 min

## 2020-03-01 NOTE — Therapy (Signed)
[place] MAIN [facility] SERVICES [address] [place], [place], [address] Phone: [phone]   Fax:  [phone]  Physical Therapy Treatment  Patient Details  Name: Preston Mcgee MRN: [id number] Date of Birth: 08/19/1990 Referring Provider (PT): Stoiff    Encounter Date: 03/01/2020   PT End of Session - 03/01/20 1457    Visit Number 6    Number of Visits 10    Date for PT Re-Evaluation 03/25/20    PT Start Time 2536    PT Stop Time 1500    PT Time Calculation (min) 56 min    Activity Tolerance No increased pain;Patient tolerated treatment well    Behavior During Therapy [facility] for tasks assessed/performed           No past medical history on file.  No past surgical history on file.  There were no vitals filed for this visit.   Subjective Assessment - 03/01/20 1408    Subjective Pt reports the day after last session, pt noticed pulsing at the R LQ abdomen which eased that night. He also noticed pain level 3/10 from R LQ to scrotum. This pain decreased to 0/10 after a few hours . Prior to last session, pt had an erection and he felt pain after the erection.  He has not had an erection since last session to compare if he had pain. Pt reports his sperm from few weeks ago appeared slimy and hard and then later, more transparent. Last year he got COVID ( Dec 2020)  and it is hard to detect whether the foul smell with his semen. The first time he noticed the foul smell of semen was this summer.    Patient is accompained by: Interpreter    Pertinent History Past physical workout: run, jog, soccer, performed sit ups/ crunches. 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    Patient Stated Goals address LBP, erection/ ejaculation issues, and having bowel movements / urination without pain              [facility] PT Assessment - 03/01/20 1558      Posture/Postural Control    Posture Comments standing with knees unlocked without cues      Palpation   Spinal mobility less paraspinal mm tightness    Palpation comment fascial restrictions over supra pubic  R > L, no pain at R LQ at depth similar to last session   post Tx: pt reported feeling pinpointed pain at R LQ with selfpalpation post Tx.                     Pelvic Floor Special Questions - 03/01/20 1500    External Perineal Exam through clothing: tightness at bulbospongiosus. ischicavernosus B , fascial restrictions on R suprapubic ( pt reported he used to lean on his R hip when driving his truck             [facility] Adult PT Treatment/Exercise - 03/01/20 1558      Therapeutic Activites    Other Therapeutic Activities explained anatomy and function of pelvic floor and positive encouragement for his progress, advised if he notices pulsing in R LQ of abdomen to come back, to seek medical attention immediately      Neuro Re-ed    Neuro Re-ed Details  cued anterior tilt of pelvis,      Modalities   Modalities --   moist  heat     Moist Heat Therapy   Number Minutes Moist Heat 5 Minutes    Moist Heat Location --   perineum     Manual Therapy   Manual therapy comments fascial releases over R LQ by pubic symphysis with lower trunk rotation   STM/MWM at pelvic floor anterior thru clothing                      PT Long Term Goals - 03/01/20 1429      PT LONG TERM GOAL #1   Title Pt will report decreased pain with urination and bowel movement by 50% in order to improve QOL  and ADLs  ( 12/27: no pain with urination and BM)    Time 4    Period Weeks    Status Achieved      PT LONG TERM GOAL #2   Title Pt will report improved stool consistency with Type 4 25% to > 75% of the time in order have less straining    Time 8    Period Weeks    Status Partially Met      PT LONG TERM GOAL #3   Title Pt will decrease FOTO score for Pelvic Pain  and PFDI Urinary from 67 pts to < 57 pts in  order to improve pelvic function    Time 10    Period Weeks    Status On-going      PT LONG TERM GOAL #4   Title Pt will demo proper body mechanics to minimize straining abdominopelvic regions and improved posture ( less slumped) and 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  in order to optimize IAP system    Time 6    Period Weeks    Status Achieved      PT LONG TERM GOAL #5   Title Pt will report decreased pain by 50% with ejaculation, urination, and with bowel movements in order to improve ADLs  ( 12/27: no pain with urination/ bowel movements, pt has not had erection since last session to compare)    Time 6    Period Weeks    Status On-going      PT LONG TERM GOAL #6   Title Pt will report less bloating sensation by 50% and demo proper techniques for modified ab strengthening exercises  in order to promote less strain at abdominopelvic region while performing fitness routines.    Time 4    Period Weeks    Status On-going      PT LONG TERM GOAL #7   Title Pt will demo decreased abdominal separation from 3 fingers width to < 2 fingers width in order to improve IAP system/ postural support to progress to proper abdominal and fitness exercises    Time 8    Period Weeks    Status Partially Met      PT LONG TERM GOAL #8   Title Pt will demo equal alignment of pelvic girdle across 2 sessions and no referred pain to LBP area with palpation at pubic symphysis across 2 sessions in order for pt to bend forward with less pain    Time 4    Period Weeks    Status Achieved                 Plan - 03/01/20 1458    Clinical Impression Statement Pt showed good carry over with less paraspinal mm tightness, proper standing without  hyperextended knees and less slouched sitting.  Pt reported pulsing sensation at R LQ occurred 2nd day after last session but it resolved. Educated pt onto seek medical attention for pulsing sensation if it returns.   Continued to address tight  pelvic floor mm and tight fascial restrictions over R LQ area today with external techniques through clothing. Suspect this area has been restricted due to pt's report of consistently leaning to his R hip when driving his truck prior to [facility]. Post Tx, pt reported sensitivity/ warmth to pinpointed area at R LQ when touching it but no pain. Pt was encouraged to keep stretching anterior abdominopelvic area with stretches provided during his truck stops to minimize pubic pain.   Provided anatomy and physiology explanations about all functions of pelvic floor, reinforced positive improvements : resolved low back pain, no pain with urination and bowel movements. Answered his questions re: sexual function of pelvic floor and explained continued Tx to mobilize the abdominopelvic area for improved circulation can help with sexual function of pelvic floor .      Pt continues to benefit from skilled PT.        Personal Factors and Comorbidities Fitness    Examination-Activity Limitations Squat;Lift;Bend;Toileting    Stability/Clinical Decision Making Evolving/Moderate complexity    Rehab Potential Good    PT Frequency 1x / week    PT Duration Other (comment)   10   PT Treatment/Interventions Balance training;Therapeutic exercise;Neuromuscular re-education;Therapeutic activities;Moist Heat;Manual techniques;Taping;Patient/family education;Functional mobility training;Gait training;Dry needling    Consulted and Agree with Plan of Care Patient           Patient will benefit from skilled therapeutic intervention in order to improve the following deficits and impairments:  Decreased safety awareness,Decreased coordination,Decreased range of motion,Difficulty walking,Decreased endurance,Decreased balance,Decreased activity tolerance,Improper body mechanics,Increased muscle spasms,Postural dysfunction,Pain,Decreased mobility  Visit Diagnosis: Myalgia  Sacrococcygeal disorders, not elsewhere  classified  Diastasis recti  Muscle weakness (generalized)     Problem List There are no problems to display for this patient.   Jerl Mina ,PT, DPT, E-RYT  03/01/2020, 4:39 PM  [place] MAIN [facility] SERVICES [address] [place], [place], [address] Phone: [phone]   Fax:  [phone]  Name: Preston Mcgee MRN: [id number] Date of Birth: 03/22/1990

## 2020-03-08 ENCOUNTER — Ambulatory Visit: Payer: 59 | Admitting: Physical Therapy

## 2020-03-09 ENCOUNTER — Ambulatory Visit: Payer: 59 | Admitting: Physical Therapy

## 2020-03-15 ENCOUNTER — Other Ambulatory Visit: Payer: Self-pay

## 2020-03-15 ENCOUNTER — Ambulatory Visit: Payer: 59 | Attending: Urology | Admitting: Physical Therapy

## 2020-03-15 ENCOUNTER — Encounter: Payer: Self-pay | Admitting: Physical Therapy

## 2020-03-15 DIAGNOSIS — M6208 Separation of muscle (nontraumatic), other site: Secondary | ICD-10-CM | POA: Diagnosis present

## 2020-03-15 DIAGNOSIS — M6281 Muscle weakness (generalized): Secondary | ICD-10-CM | POA: Diagnosis present

## 2020-03-15 DIAGNOSIS — M791 Myalgia, unspecified site: Secondary | ICD-10-CM | POA: Diagnosis present

## 2020-03-15 DIAGNOSIS — M533 Sacrococcygeal disorders, not elsewhere classified: Secondary | ICD-10-CM | POA: Diagnosis present

## 2020-03-15 NOTE — Patient Instructions (Signed)
Backward lunges with band at doorknob  2 mins . Elbows by ribs, shoulders down and back  Front knee in place above ankle, back foot and toes pointed forward, ( do not turn the hips and toes out) . Heel is up to be able to push off and return R foot next to the L at hip width apart  Carry your center with you as you step to maintain 50% weight in both legs.      WALKING WITH RESISTANCE BLUE Band in hands by pocket ( thumbs out )  at waist connected to doorknob Stepping forward normal length steps, planting mid and forefoot down, center of mass ( navel) leans forward slightly as if you were walking uphill 3-4 steps till band feels taut ( MAKE SURE THE DOOR IS LOCKED AND WON'T OPEN)   Stepping backwards, lower heel slowly, carry trunk and hips back , leaning forward, front knee along 2-3 rd toe line  2 min   Multifidis twist  Band is on doorknob: stand further away from door (facing perpendicular)   Twisting trunk without moving the hips and knees Hold band at the level of ribcage, elbows bent,shoulder blades roll back and down like squeezing a pencil under armpit    Exhale twist,.10-15 deg away from door without moving your hips/ knees. Continue to maintain equal weight through legs. Keep knee unlocked.  10 x 2

## 2020-03-16 NOTE — Therapy (Addendum)
[place] MAIN [facility] SERVICES [address] [place], [place], [address] Phone: [phone]   Fax:  [phone]  Physical Therapy Treatment / progress note  Patient Details  Name: Preston Mcgee MRN: [id number] Date of Birth: February 08, 1991 Referring Provider (PT): Stoiff    Encounter Date: 03/15/2020   PT End of Session - 03/15/20 1408    Visit Number 7    Number of Visits 10    Date for PT Re-Evaluation 03/25/20    PT Start Time 1402    PT Stop Time 1500    PT Time Calculation (min) 58 min    Activity Tolerance No increased pain;Patient tolerated treatment well    Behavior During Therapy [facility] for tasks assessed/performed           Past Medical History:  Diagnosis Date  . COVID Dec 2019-2020   during COVID loss of smell/ taste, fever, HA, "breaking bones" .   After COVID: smell is still reduced     No past surgical history on file.  There were no vitals filed for this visit.   Subjective Assessment - 03/15/20 1404    Subjective Pt noticed the R abdominal / burning pain causes pressure with seatbelts. No more pain with bowel movements. Pain with erections and ejaculation but it resolves after sleeping. Pt does his HEP 4/7 days once a day. Foul smell in semen appeared less last week but pt is not sure beause he didn ot regain his smell after getting COVID Dec 2019-2020. Pt has LBP after standing all day.     Patient is accompained by: Interpreter    Pertinent History Past physical workout: run, jog, soccer, performed sit ups/ crunches. 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    Patient Stated Goals address LBP, erection/ ejaculation issues, and having bowel movements / urination without pain              [facility] PT Assessment - 03/16/20 1752      Observation/Other Assessments   Observations decreased hyperextension of Bknees, anterior COM without cues       Coordination   Gross Motor Movements are Fluid and Coordinated --   poor coordination trunk/ BLE diassociation     Palpation   SI assessment  levelled pelvic girdle, less paraspinal mm bulk,                         [facility] Adult PT Treatment/Exercise - 03/16/20 1751      Therapeutic Activites    Other Therapeutic Activities explained progression, referred for more imaging, encouraged about pelvic floor therapy with compliance to HEP and balancing overuse of mm and optimizing IAP to improve pelvic floor Sx      Neuro Re-ed    Neuro Re-ed Details  cued for diassocaition of trunk and pelvis/BLE,  thoracolumbar strengtehning HEP technique                       PT Long Term Goals - 03/15/20      PT LONG TERM GOAL #1   Title Pt will report decreased pain with urination and bowel movement by 50% in order to improve QOL  and ADLs  ( 12/27: no pain with urination and BM)    Time 4    Period Weeks    Status Achieved      PT LONG TERM  GOAL #2   Title Pt will report improved stool consistency with Type 4 25% to > 75% of the time in order have less straining    Time 8    Period Weeks    Status Partially Met      PT LONG TERM GOAL #3   Title Pt will decrease FOTO score for Pelvic Pain  and PFDI Urinary from 67 pts to < 57 pts in order to improve pelvic function    Time 10    Period Weeks    Status On-going      PT LONG TERM GOAL #4   Title Pt will demo proper body mechanics to minimize straining abdominopelvic regions and improved posture ( less slumped) and 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  in order to optimize IAP system    Time 6    Period Weeks    Status Achieved      PT LONG TERM GOAL #5   Title Pt will report decreased pain by 50% with ejaculation, urination, and with bowel movements in order to improve ADLs  ( 12/27: no pain with urination/ bowel movements, pt has not had erection since last session to compare)    Time 6     Period Weeks    Status On-going      PT LONG TERM GOAL #6   Title Pt will report less bloating sensation by 50% and demo proper techniques for modified ab strengthening exercises  in order to promote less strain at abdominopelvic region while performing fitness routines.    Time 4    Period Weeks    Status On-going      PT LONG TERM GOAL #7   Title Pt will demo decreased abdominal separation from 3 fingers width to < 2 fingers width in order to improve IAP system/ postural support to progress to proper abdominal and fitness exercises    Time 8    Period Weeks    Status Achieved     PT LONG TERM GOAL #8   Title Pt will demo equal alignment of pelvic girdle across 2 sessions and no referred pain to LBP area with palpation at pubic symphysis across 2 sessions in order for pt to bend forward with less pain    Time 4    Period Weeks    Status Achieved                 Plan - 03/15/20 1456    Clinical Impression Statement Pt has achieved 4/8 goals and progressing towards goals across the past 7 visits.    Functional improvements:  Pt no longer experiences no pelvic pain with urination and bowel movements.   Structural improvements: Pt has demo'd equal pelvic alignment and no more spinal deviations, increased propioception with standing/ sitting posture to minimize overactivity of pelvic floor, and  resolved diastasis recti. These improvements indicate a more effective IAP system to help with pelvic dysfunctions and LBP. Pt has been compliant with sitting/ standing posture and occupational modifications to minimize sacral sitting/ shortening of pelvic floor mm, and loss of lumbar lordosis to help with his pelvic and LBP. Today, advanced pt to posterior back mm strengthening as his posterior chain of mm were underemphasized in his fitness/ gym routine.   Pt reports noticing gradual improvements with pain but is also concerned about non-musculoskeletal source for suprapubic pain, foul  odor with semen, pain with erection and ejaculation.  Therapist plans to contact  urologist re: pt's request. Pt is interested in continuing with Pelvic PT but would like to ensure non-musculoskeletal issues are r/o.   Pt continues to benefit from skilled PT.      Personal Factors and Comorbidities Fitness    Examination-Activity Limitations Squat;Lift;Bend;Toileting    Stability/Clinical Decision Making Evolving/Moderate complexity    Rehab Potential Good    PT Frequency 1x / week    PT Duration Other (comment)   10   PT Treatment/Interventions Balance training;Therapeutic exercise;Neuromuscular re-education;Therapeutic activities;Moist Heat;Manual techniques;Taping;Patient/family education;Functional mobility training;Gait training;Dry needling    Consulted and Agree with Plan of Care Patient           Patient will benefit from skilled therapeutic intervention in order to improve the following deficits and impairments:  Decreased safety awareness,Decreased coordination,Decreased range of motion,Difficulty walking,Decreased endurance,Decreased balance,Decreased activity tolerance,Improper body mechanics,Increased muscle spasms,Postural dysfunction,Pain,Decreased mobility  Visit Diagnosis: No diagnosis found.     Problem List There are no problems to display for this patient.   Jerl Mina 03/16/2020, 5:58 PM  [place] MAIN [facility] SERVICES [address] [place], [place], [address] Phone: [phone]   Fax:  [phone]  Name: Preston Mcgee MRN: [id number] Date of Birth: 11-17-1990

## 2020-03-19 ENCOUNTER — Telehealth: Payer: Self-pay | Admitting: *Deleted

## 2020-03-19 NOTE — Telephone Encounter (Signed)
Received call from PT Preston Mcgee regarding concern from patient need for imaging due to ongoing musculoskeletal pain. Please advise

## 2020-03-19 NOTE — Telephone Encounter (Signed)
Patient notified and will call [facility] to establish care with a PCP.

## 2020-03-19 NOTE — Telephone Encounter (Signed)
If PT feels he needs imaging for musculoskeletal pain he needs to follow-up with his PCP.  He had a CT of the abdomen and pelvis which was unremarkable

## 2020-03-22 ENCOUNTER — Encounter: Payer: 59 | Admitting: Physical Therapy

## 2020-03-29 ENCOUNTER — Encounter: Payer: 59 | Admitting: Physical Therapy

## 2020-03-31 ENCOUNTER — Encounter: Payer: 59 | Admitting: Physical Therapy

## 2020-04-02 ENCOUNTER — Ambulatory Visit: Payer: Self-pay | Admitting: Physician Assistant

## 2020-04-05 ENCOUNTER — Other Ambulatory Visit: Payer: Self-pay

## 2020-04-05 ENCOUNTER — Ambulatory Visit: Payer: 59 | Admitting: Physical Therapy

## 2020-04-05 DIAGNOSIS — M791 Myalgia, unspecified site: Secondary | ICD-10-CM

## 2020-04-05 DIAGNOSIS — M6208 Separation of muscle (nontraumatic), other site: Secondary | ICD-10-CM

## 2020-04-05 DIAGNOSIS — M533 Sacrococcygeal disorders, not elsewhere classified: Secondary | ICD-10-CM

## 2020-04-05 NOTE — Therapy (Addendum)
[place] MAIN [facility] SERVICES [address] [place], [place], [address] Phone: [phone]   Fax:  [phone]  Physical Therapy Treatment  Patient Details  Name: Preston Mcgee MRN: [id number] Date of Birth: 02-20-1991 Referring Provider (PT): Stoiff    Encounter Date: 04/05/2020   PT End of Session - 04/05/20 1417    Visit Number 8    Date for PT Re-Evaluation 26/41/58   recert on 05/13/38   PT Start Time 7680    PT Stop Time 1500    PT Time Calculation (min) 57 min    Activity Tolerance No increased pain;Patient tolerated treatment well    Behavior During Therapy [facility] for tasks assessed/performed           Past Medical History:  Diagnosis Date  . COVID Dec 2019-2020   during COVID loss of smell/ taste, fever, HA, "breaking bones" .   After COVID: smell is still reduced     No past surgical history on file.  There were no vitals filed for this visit.   Subjective Assessment - 04/05/20 1407    Subjective Pt reported pt feels much better by 20% for the LBP. Pt is able to stand for longer periods of time with less low back pain unless it is very cold.    Pt is feeling 20%  less pain with ejaculation and erection. Pt can still smell foul odor with semen. No more pain with urination and bowel movements.  No change to burning to the R side low abdomen. Pt has heard back from the urology clinic has said to him that they will not do another CT scan because it was done already and they did not find anything. They referred him back to PCP. Pt is concerned that his burning sensation in low abdomen started when he was doing sit ups and crunches 2 years ago.    Patient is accompained by: Interpreter    Pertinent History Past physical workout: run, jog, soccer, performed sit ups/ crunches. 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    Patient Stated Goals address LBP,  erection/ ejaculation issues, and having bowel movements / urination without pain              [facility] PT Assessment - 04/05/20 1450      Observation/Other Assessments   Observations self correct with upright posture, more anterior tilt of pelvis  and in standing . Reviewed band HEP with cue for less chest breathing      Palpation   Palpation comment palpation at R low abdomen at level of supra pubic area/ inguinal + burning pain with head lift, no pain with lifting R leg, + with cue for coughing.  Following test, pt reported pulsing pain down to R scrotum                         [facility] Adult PT Treatment/Exercise - 04/05/20 2002      Therapeutic Activites    Other Therapeutic Activities discussed referral to MD for evaluation of R abdominal pain that is persisting   reassessment     Neuro Re-ed    Neuro Re-ed Details  cued for previous HEP , explained tricep dips in place of psuh ups for now  PT Long Term Goals - 03/19/20 1416      PT LONG TERM GOAL #1   Title Pt will report decreased pain with urination and bowel movement by 50% in order to improve QOL  and ADLs  ( 12/27: no pain with urination and BM)    Time 4    Period Weeks    Status Achieved      PT LONG TERM GOAL #2   Title Pt will report improved stool consistency with Type 4 25% to > 75% of the time in order have less straining    Time 8    Period Weeks    Status Partially Met      PT LONG TERM GOAL #3   Title Pt will decrease FOTO score for Pelvic Pain  and PFDI Urinary from 67 pts to < 57 pts in order to improve pelvic function    Time 10    Period Weeks    Status On-going      PT LONG TERM GOAL #4   Title Pt will demo proper body mechanics to minimize straining abdominopelvic regions and improved posture ( less slumped) and 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  in order to optimize IAP system    Time 6    Period Weeks    Status Achieved       PT LONG TERM GOAL #5   Title Pt will report decreased pain by 50% with ejaculation, urination, and with bowel movements in order to improve ADLs  ( 12/27: no pain with urination/ bowel movements, pt has not had erection since last session to compare)    Time 6    Period Weeks    Status On-going      PT LONG TERM GOAL #6   Title Pt will report less bloating sensation by 50% and demo proper techniques for modified ab strengthening exercises  in order to promote less strain at abdominopelvic region while performing fitness routines.    Time 4    Period Weeks    Status On-going      PT LONG TERM GOAL #7   Title Pt will demo decreased abdominal separation from 3 fingers width to < 2 fingers width in order to improve IAP system/ postural support to progress to proper abdominal and fitness exercises    Time 8    Period Weeks    Status Achieved      PT LONG TERM GOAL #8   Title Pt will demo equal alignment of pelvic girdle across 2 sessions and no referred pain to LBP area with palpation at pubic symphysis across 2 sessions in order for pt to bend forward with less pain    Time 4    Period Weeks    Status Achieved                 Plan - 04/05/20 1425    Clinical Impression Statement Pt is progressing well as pt is no longer needing to require cues for sitting and standing posture. Pt is making progress with LBP. Pt demonstrates resolved diastasis recti, improved posture, and  also demo'd decreased pelvic floor mm tightness. Focusing on helping pt integrate back to fitness routines that do not create more shortening of pelvic floor/ groin/ hip/ pect mm regions and implement deep core awareness.  Provided education today regarding    Pt is concerned that his burning sensation in low abdomen started when he was doing sit ups  and crunches 2 years ago.   Today's assessment involved concordant sign with palpation at R low abdomen at level of supra pubic area/ inguinal when instructed to  perform head lift and to cough. No pain occurred with lifting R leg ( SLR) . Following test, pt reported pulsing pain down to R scrotum   Pt is seeking for additional work up. DPT communicated with [facility] to screen for hernia and clinic staff stated they will reach out to pt to schedule an appt with Dr. Lysle Pearl. Communicated with clinic staff pt prefers Monday because he has already taken off from work and his girlfriend's phone number was provided per pt's request as the best number to reach him.   Pt continues to benefit from skilled PT.     Personal Factors and Comorbidities Fitness    Examination-Activity Limitations Squat;Lift;Bend;Toileting    Stability/Clinical Decision Making Evolving/Moderate complexity    Rehab Potential Good    PT Frequency 1x / week    PT Duration Other (comment)   10   PT Treatment/Interventions Balance training;Therapeutic exercise;Neuromuscular re-education;Therapeutic activities;Moist Heat;Manual techniques;Taping;Patient/family education;Functional mobility training;Gait training;Dry needling    Consulted and Agree with Plan of Care Patient           Patient will benefit from skilled therapeutic intervention in order to improve the following deficits and impairments:  Decreased safety awareness,Decreased coordination,Decreased range of motion,Difficulty walking,Decreased endurance,Decreased balance,Decreased activity tolerance,Improper body mechanics,Increased muscle spasms,Postural dysfunction,Pain,Decreased mobility  Visit Diagnosis: Sacrococcygeal disorders, not elsewhere classified  Myalgia  Diastasis recti     Problem List There are no problems to display for this patient.   Jerl Mina ,PT, DPT, E-RYT  04/05/2020, 8:06 PM  [place] MAIN [facility] SERVICES [address] [place], [place], [address] Phone: [phone]   Fax:  [phone]  Name: Preston Mcgee MRN: [id number] Date of Birth: 09/16/1990

## 2020-04-05 NOTE — Patient Instructions (Signed)
Tricep dips instead of pushups for now  10 reps   __  Continue with exercises  GOOD JOB! ___   Water intake increase from 32 fl oz to 48-70 fl oz Warm temp not cold

## 2020-04-12 ENCOUNTER — Ambulatory Visit: Payer: 59 | Admitting: Physical Therapy

## 2020-04-19 ENCOUNTER — Other Ambulatory Visit: Payer: Self-pay

## 2020-04-19 ENCOUNTER — Ambulatory Visit: Payer: 59 | Attending: Urology | Admitting: Physical Therapy

## 2020-04-19 DIAGNOSIS — M6208 Separation of muscle (nontraumatic), other site: Secondary | ICD-10-CM | POA: Insufficient documentation

## 2020-04-19 DIAGNOSIS — M533 Sacrococcygeal disorders, not elsewhere classified: Secondary | ICD-10-CM | POA: Insufficient documentation

## 2020-04-19 DIAGNOSIS — M6281 Muscle weakness (generalized): Secondary | ICD-10-CM | POA: Insufficient documentation

## 2020-04-19 DIAGNOSIS — M791 Myalgia, unspecified site: Secondary | ICD-10-CM | POA: Insufficient documentation

## 2020-04-19 NOTE — Therapy (Signed)
[place] [facility] MAIN [facility] SERVICES [address] [place], [place], [address] Phone: [phone]   Fax:  [phone]  Patient Details  Name: Preston Mcgee MRN: [id number] Date of Birth: October 20, 1990 Referring Provider:  Riki Altes, MD  Encounter Date: 04/19/2020  Pt arrived one hour early for this appointment but left and was not able to stay.   Mariane Masters ,PT, DPT, E-RYT  04/19/2020, 4:20 PM  [place] [facility] MAIN [facility] SERVICES [address] [place], [place], [address] Phone: [phone]   Fax:  [phone]

## 2020-04-22 ENCOUNTER — Encounter: Payer: Self-pay | Admitting: Physical Therapy

## 2020-04-22 DIAGNOSIS — M6281 Muscle weakness (generalized): Secondary | ICD-10-CM

## 2020-04-22 DIAGNOSIS — M6208 Separation of muscle (nontraumatic), other site: Secondary | ICD-10-CM

## 2020-04-22 DIAGNOSIS — M533 Sacrococcygeal disorders, not elsewhere classified: Secondary | ICD-10-CM

## 2020-04-22 DIAGNOSIS — M791 Myalgia, unspecified site: Secondary | ICD-10-CM

## 2020-04-22 NOTE — Therapy (Addendum)
[place] [facility] MAIN [facility] SERVICES [address] [place], [place], [address] Phone: [phone]   Fax:  [phone]  Patient Details  Name: Preston Mcgee MRN: [id number] Date of Birth: 1991/01/02 Referring Provider:  Dr. Irineo Axon  ( Urologist)  Encounter Date: 04/22/2020    DPT faxed this note to Dr. Tonna Boehringer [facility]) as pt is getting referred to him to screen for possibility of hernia and other medical workup as needed.    Dear Dr. Tonna Boehringer,  I would like to seek your expertise for a medical work-up for Preston Mcgee (DOB [birth year]) who is scheduled with you 05/03/20. Pt was referred by urologist to me for pelvic health physical therapy and completed 8 visits with significant progress with less LBP and no more pain with difficulty with urination/ bowel movements. Pelvic floor/ back mm tensions have decreased significantly leading to better upright posture and less straining at abdominopelvic area. We are working on modifying his fitness routine with alternatives to sit-ups / crunches as his Sx started 2 years ago after performing these type of exercises.   In light of these improvements, there is one Sx that has not changed with PT. It appears non-musculoskeletal in origin. Pt reports a burning / pinching pain that is tender to the touch, located at inguinal area/ R testicle area and " inside" by his low back. It continues to persist and more recently lasting for a whole day. He also has been concerned about a foul smell to his semen. Upon assessment on 04/05/20, this area was painful to the touch and when instructed to perform head lift from a supine position and with a cue to cough. No pain occurred with lifting R leg. Following test, pt reported pulsing pain down to R scrotum.  I appreciate your assistance with his case.  Thank you,  Sincerely,  Mariane Masters ,PT, DPT, E-RYT  04/22/2020, 3:52 PM  [place] [facility] MAIN [facility] SERVICES [address] [place], [place], [address] Phone: [phone]   Fax:  [phone]

## 2020-04-26 ENCOUNTER — Ambulatory Visit: Payer: 59 | Admitting: Physical Therapy

## 2020-04-26 ENCOUNTER — Other Ambulatory Visit: Payer: Self-pay

## 2020-04-26 DIAGNOSIS — M6208 Separation of muscle (nontraumatic), other site: Secondary | ICD-10-CM | POA: Diagnosis present

## 2020-04-26 DIAGNOSIS — M6281 Muscle weakness (generalized): Secondary | ICD-10-CM

## 2020-04-26 DIAGNOSIS — M533 Sacrococcygeal disorders, not elsewhere classified: Secondary | ICD-10-CM

## 2020-04-26 DIAGNOSIS — M791 Myalgia, unspecified site: Secondary | ICD-10-CM | POA: Diagnosis present

## 2020-04-26 NOTE — Therapy (Signed)
[place] MAIN [facility] SERVICES [address] [place], [place], [address] Phone: [phone]   Fax:  [phone]  Physical Therapy Treatment  Patient Details  Name: Preston Mcgee MRN: [id number] Date of Birth: 07-30-1990 Referring Provider (PT): Stoiff    Encounter Date: 04/26/2020   PT End of Session - 04/26/20 1509    Visit Number 9    Date for PT Re-Evaluation 69/62/95   recert on 2/84/13   PT Start Time 1507    PT Stop Time 2440    PT Time Calculation (min) 35 min    Activity Tolerance No increased pain;Patient tolerated treatment well    Behavior During Therapy [facility] for tasks assessed/performed           Past Medical History:  Diagnosis Date  . COVID Dec 2019-2020   during COVID loss of smell/ taste, fever, HA, "breaking bones" .   After COVID: smell is still reduced     No past surgical history on file.  There were no vitals filed for this visit.   Subjective Assessment - 04/26/20 1509    Subjective Pt reported felt more pain in the inside and the previous areas of pain were not painful. Pt is walking only 10 min    Patient is accompained by: Interpreter    Pertinent History Past physical workout: run, jog, soccer, performed sit ups/ crunches. 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    Patient Stated Goals address LBP, erection/ ejaculation issues, and having bowel movements / urination without pain              [facility] PT Assessment - 04/26/20 1601      Observation/Other Assessments   Observations improved deep core      Posture/Postural Control   Posture Comments improved coordination with breathing                         [facility] Adult PT Treatment/Exercise - 04/26/20 1601      Neuro Re-ed    Neuro Re-ed Details  cued for 3 new HEP for less rounded shoulders. modified in ant gravity position and pt reported no pain                        PT Long Term Goals - 03/19/20 1416      PT LONG TERM GOAL #1   Title Pt will report decreased pain with urination and bowel movement by 50% in order to improve QOL  and ADLs  ( 12/27: no pain with urination and BM)    Time 4    Period Weeks    Status Achieved      PT LONG TERM GOAL #2   Title Pt will report improved stool consistency with Type 4 25% to > 75% of the time in order have less straining    Time 8    Period Weeks    Status Partially Met      PT LONG TERM GOAL #3   Title Pt will decrease FOTO score for Pelvic Pain  and PFDI Urinary from 67 pts to < 57 pts in order to improve pelvic function    Time 10    Period Weeks    Status On-going      PT LONG TERM GOAL #4   Title Pt will demo proper body mechanics to minimize  straining abdominopelvic regions and improved posture ( less slumped) and 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  in order to optimize IAP system    Time 6    Period Weeks    Status Achieved      PT LONG TERM GOAL #5   Title Pt will report decreased pain by 50% with ejaculation, urination, and with bowel movements in order to improve ADLs  ( 12/27: no pain with urination/ bowel movements, pt has not had erection since last session to compare)    Time 6    Period Weeks    Status On-going      PT LONG TERM GOAL #6   Title Pt will report less bloating sensation by 50% and demo proper techniques for modified ab strengthening exercises  in order to promote less strain at abdominopelvic region while performing fitness routines.    Time 4    Period Weeks    Status On-going      PT LONG TERM GOAL #7   Title Pt will demo decreased abdominal separation from 3 fingers width to < 2 fingers width in order to improve IAP system/ postural support to progress to proper abdominal and fitness exercises    Time 8    Period Weeks    Status Achieved      PT LONG TERM GOAL #8   Title Pt will demo equal alignment of pelvic  girdle across 2 sessions and no referred pain to LBP area with palpation at pubic symphysis across 2 sessions in order for pt to bend forward with less pain    Time 4    Period Weeks    Status Achieved                 Plan - 04/26/20 1617    Clinical Impression Statement Pt reported he felt more pain located inside around his waist this week but there is no more pain in the  muscular areas he used ot have pain since he has started Pelvic PT. Pt has an appt with Dr. Lysle Pearl at [facility] for next Monday for a medical workup to examine this area.  A note has been faxed to him. Today, provided exercises with resistance band for strengthening scauplothoracic and RTC mm. These were performed in antigravity  position with consideration to not cause any straining to the area of R abdominal/ pelvic area where he feels the inside pain. Plan to wait to hear the results of his medical workup before advancing him to upright positions.  Pt reported no pain with exercises today and was explained to withhold from multidifis twist. Session was abbrieviated to save time to escort pt to the [facility] so he understood where to go for his appt with Dr. Lysle Pearl next week . Pt continues to benefit from skilled PT. Plan to correspond with Dr. Lysle Pearl.    Personal Factors and Comorbidities Fitness    Examination-Activity Limitations Squat;Lift;Bend;Toileting    Stability/Clinical Decision Making Evolving/Moderate complexity    Rehab Potential Good    PT Frequency 1x / week    PT Duration Other (comment)   10   PT Treatment/Interventions Balance training;Therapeutic exercise;Neuromuscular re-education;Therapeutic activities;Moist Heat;Manual techniques;Taping;Patient/family education;Functional mobility training;Gait training;Dry needling    Consulted and Agree with Plan of Care Patient           Patient will benefit from skilled therapeutic intervention in order to improve the following deficits and  impairments:  Decreased safety  awareness,Decreased coordination,Decreased range of motion,Difficulty walking,Decreased endurance,Decreased balance,Decreased activity tolerance,Improper body mechanics,Increased muscle spasms,Postural dysfunction,Pain,Decreased mobility  Visit Diagnosis: Sacrococcygeal disorders, not elsewhere classified  Myalgia  Diastasis recti  Muscle weakness (generalized)     Problem List There are no problems to display for this patient.   Jerl Mina ,PT, DPT, E-RYT  04/26/2020, 4:34 PM  [place] MAIN [facility] SERVICES [address] [place], [place], [address] Phone: [phone]   Fax:  [phone]  Name: Preston Mcgee MRN: [id number] Date of Birth: [birth year]

## 2020-04-26 NOTE — Patient Instructions (Signed)
Lying on back, knees bent    band under ballmounds  while laying on back w/ knees bent  "W" exercise  10 reps x 2 sets   Band is placed under feet, knees bent, feet are hip width apart Hold band with thumbs point out, keep upper arm and elbow touching the bed the whole time  - inhale and then exhale pull bands by bending elbows hands move in a "w"  (feel shoulder blades squeezing)    Oblique/ scapula stabilization   Opposite arm   Place band in "U"    band under ballmounds  while laying on back w/ knees bent     20 reps  on each side  Holding band from opposite thigh,  Inhale,    exhale then pull band across body while keeping elbow , shoulders, back of the head pressed down    ______________    Rotator Cuff Blue band  In hand, thumbs out Pull apart with exhale   With elbows down on bed  30 reps

## 2020-05-03 ENCOUNTER — Ambulatory Visit: Payer: 59 | Admitting: Physical Therapy

## 2020-06-07 ENCOUNTER — Ambulatory Visit: Payer: Self-pay | Admitting: Surgery

## 2020-06-14 ENCOUNTER — Other Ambulatory Visit: Payer: Self-pay

## 2020-06-14 ENCOUNTER — Ambulatory Visit (INDEPENDENT_AMBULATORY_CARE_PROVIDER_SITE_OTHER): Payer: 59 | Admitting: Surgery

## 2020-06-14 ENCOUNTER — Encounter: Payer: Self-pay | Admitting: Surgery

## 2020-06-14 VITALS — BP 121/78 | HR 75 | Temp 98.3°F | Ht 64.0 in | Wt 197.0 lb

## 2020-06-14 DIAGNOSIS — G8929 Other chronic pain: Secondary | ICD-10-CM | POA: Diagnosis not present

## 2020-06-14 DIAGNOSIS — R1031 Right lower quadrant pain: Secondary | ICD-10-CM | POA: Diagnosis not present

## 2020-06-14 MED ORDER — TRIAMCINOLONE ACETONIDE 40 MG/ML IJ SUSP
40.0000 mg | Freq: Once | INTRAMUSCULAR | Status: AC
  Administered 2020-06-14: 40 mg via INTRAMUSCULAR

## 2020-06-14 NOTE — Patient Instructions (Addendum)
Today, we have done an Ileo-Inguinal Nerve Block today. The medication that we used may last hours to several days. If your pain returns, please call our office so that we may give you the next step in pain relief.  You do not need to keep a bandage over the area where this has been done.  You may shower as you normally do.  Please call our office with any questions or concerns that you have.  Follow up here in 2 weeks.   Peripheral Nerve Block What is a peripheral nerve block? A peripheral nerve block is a method of using a type of medicine that is injected into an area of the body to numb everything below the injection site (regional anesthetic). The medicine is injected around the nerve that provides feeling to the area where you will have a surgical procedure done. A peripheral nerve block is done so that you do not feel any pain during your procedure. You may be numb for up to 24 hours after your peripheral nerve block is done, depending on the type of medicine used.  What are some reasons for having a peripheral nerve block? You may have a peripheral nerve block to relieve pain associated with many types of procedures, such as surgery on any parts of your limbs, your hip, your shoulder, or your head. A peripheral nerve block may be done if you are not able to receive medicine to make you fall asleep (general anesthetic) during your procedure. What are the risks of a peripheral nerve block? Generally, peripheral nerve blocks are safe. However, problems may occur, including:  Infection at the injection site.  Bleeding.  Allergic reactions to medicines.  Damage to other structures or organs, such as the nerve that is being blocked. Nerve damage can be temporary or permanent.  Bruising.  Pain. What are the benefits of a peripheral nerve block? The main benefit of a peripheral nerve block is that you will not feel pain during your procedure, and you will not be exposed to the risks  associated with receiving a general anesthetic. Other benefits may include:  Reduced need for pain medicine after your procedure.  Fewer side effects from pain medicine that you take after your procedure.  Lower risk of blood clots.  Faster recovery. How is a peripheral nerve block performed?  Your nerve is located by exam.  The area near your injection site will be cleaned with a germ-killing (antiseptic) solution.  Medicine to numb your injection area (local anesthetic) may be injected into the tissue above your nerve.  Regional anesthetic will be injected into the area near your nerve. ? The medicine will be injected around the nerve, not into it. ? You should not feel any pain during this injection. The procedure may vary among health care providers and hospitals. How can I expect to feel after a peripheral nerve block? The area where the medicine is injected will be completely numb. You should not feel any pain during your procedure. The area of the peripheral nerve block may continue to feel numb after surgery. As the medicine wears off, feeling will gradually return to the area. When you have a numb body part, you have a greater risk of injuring it because you have no sensation to tell you when something hurts. To reduce your risk of injury when you have a peripheral nerve block:  Be very careful when exposing numb body parts to heat or cold.  Do not lift heavy items.  Do not stand up or try to walk without help if you have a nerve block in one or both legs. Seek medical care if:  You develop redness, swelling, or pain around your injection site.  You have fluid or blood coming from your injection site.  Your injection site feels warm to the touch.  You have pus or a bad smell coming from your injection site.  You have pain near your injection site that gets worse.  You continue to have numbness, weakness, or tingling after your medicine has worn off. This  information is not intended to replace advice given to you by your health care provider. Make sure you discuss any questions you have with your health care provider. Document Released: 05/30/2007 Document Revised: 01/18/2016 Document Reviewed: 10/27/2014 Elsevier Interactive Patient Education  2017 [employer].

## 2020-06-15 ENCOUNTER — Encounter: Payer: Self-pay | Admitting: Surgery
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
## 2020-07-05 ENCOUNTER — Ambulatory Visit (INDEPENDENT_AMBULATORY_CARE_PROVIDER_SITE_OTHER): Payer: 59 | Admitting: Surgery

## 2020-07-05 ENCOUNTER — Encounter: Payer: Self-pay | Admitting: Surgery

## 2020-07-05 ENCOUNTER — Other Ambulatory Visit: Payer: Self-pay

## 2020-07-05 VITALS — BP 120/79 | HR 93 | Temp 98.5°F | Ht 66.0 in | Wt 194.0 lb

## 2020-07-05 DIAGNOSIS — R1031 Right lower quadrant pain: Secondary | ICD-10-CM | POA: Diagnosis not present

## 2020-07-05 DIAGNOSIS — G8929 Other chronic pain: Secondary | ICD-10-CM

## 2020-07-05 NOTE — Patient Instructions (Addendum)
Please call our office with any questions or concerns. We will reach out to you in October to schedule a November 2022 appointment with Dr.Pabon.

## 2020-07-06 ENCOUNTER — Encounter: Payer: Self-pay | Admitting: Surgery
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
## 2020-07-27 ENCOUNTER — Encounter: Payer: Self-pay | Admitting: Physical Therapy

## 2020-07-27 NOTE — Therapy (Signed)
[facility] MAIN [facility] SERVICES [address] [place], [place], [address] Phone: [phone]   Fax:  [phone]  Patient Details  Name: Preston Mcgee MRN: [id number] Date of Birth: 08-17-1990 Referring Provider:   Encounter Date: 07/27/2020  Therapist left a message with patient on 07/26/20 to f/u on his visit for 2nd opinion for his Sx with Dr. Tonna Boehringer. Stated pt can contact therapist if he has further need for resources or return to PT.    Mariane Masters ,PT, DPT, E-RYT  07/27/2020, 10:44 AM  [facility] MAIN [facility] SERVICES [address] [place], [place], [address] Phone: [phone]   Fax:  [phone]

## 2020-12-15 ENCOUNTER — Emergency Department: Payer: BC Managed Care – PPO

## 2020-12-15 ENCOUNTER — Encounter: Payer: Self-pay | Admitting: Emergency Medicine

## 2020-12-15 ENCOUNTER — Emergency Department
Admission: EM | Admit: 2020-12-15 | Discharge: 2020-12-15 | Disposition: A | Discharge status: Home / Self Care | Payer: BC Managed Care – PPO | Attending: Emergency Medicine | Admitting: Emergency Medicine

## 2020-12-15 ENCOUNTER — Other Ambulatory Visit: Payer: Self-pay

## 2020-12-15 DIAGNOSIS — W34110A Accidental malfunction of airgun, initial encounter: Secondary | ICD-10-CM | POA: Insufficient documentation

## 2020-12-15 DIAGNOSIS — Z8616 Personal history of COVID-19: Secondary | ICD-10-CM | POA: Diagnosis not present

## 2020-12-15 DIAGNOSIS — M795 Residual foreign body in soft tissue: Secondary | ICD-10-CM

## 2020-12-15 DIAGNOSIS — S6992XA Unspecified injury of left wrist, hand and finger(s), initial encounter: Secondary | ICD-10-CM | POA: Diagnosis present

## 2020-12-15 DIAGNOSIS — Z23 Encounter for immunization: Secondary | ICD-10-CM | POA: Insufficient documentation

## 2020-12-15 DIAGNOSIS — S60453A Superficial foreign body of left middle finger, initial encounter: Secondary | ICD-10-CM | POA: Diagnosis not present

## 2020-12-15 MED ORDER — OXYCODONE-ACETAMINOPHEN 5-325 MG PO TABS: 1.0000 | ORAL_TABLET | Freq: Four times a day (QID) | ORAL | 0 refills | Status: AC | PRN

## 2020-12-15 MED ORDER — TETANUS-DIPHTH-ACELL PERTUSSIS 5-2.5-18.5 LF-MCG/0.5 IM SUSY
0.5000 mL | PREFILLED_SYRINGE | Freq: Once | INTRAMUSCULAR | Status: AC
  Administered 2020-12-15: 0.5 mL via INTRAMUSCULAR
  Filled 2020-12-15: qty 0.5

## 2020-12-15 MED ORDER — LIDOCAINE HCL (PF) 1 % IJ SOLN
INTRAMUSCULAR | Status: AC
  Administered 2020-12-15: 5 mL via INTRADERMAL
  Filled 2020-12-15: qty 5

## 2020-12-15 MED ORDER — LIDOCAINE HCL (PF) 1 % IJ SOLN
INTRAMUSCULAR | Status: AC
  Filled 2020-12-15: qty 5

## 2020-12-15 MED ORDER — CEPHALEXIN 500 MG PO CAPS
500.0000 mg | ORAL_CAPSULE | Freq: Once | ORAL | Status: AC
  Administered 2020-12-15: 500 mg via ORAL
  Filled 2020-12-15: qty 1

## 2020-12-15 MED ORDER — LIDOCAINE HCL (PF) 1 % IJ SOLN
5.0000 mL | Freq: Once | INTRAMUSCULAR | Status: AC
  Administered 2020-12-15: 5 mL via INTRADERMAL
  Filled 2020-12-15: qty 5

## 2020-12-15 MED ORDER — CEPHALEXIN 500 MG PO CAPS: 500.0000 mg | ORAL_CAPSULE | Freq: Three times a day (TID) | ORAL | 0 refills | Status: DC

## 2020-12-15 MED ORDER — OXYCODONE-ACETAMINOPHEN 5-325 MG PO TABS
1.0000 | ORAL_TABLET | Freq: Once | ORAL | Status: AC
  Administered 2020-12-15: 1 via ORAL
  Filled 2020-12-15: qty 1
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
## 2021-01-19 ENCOUNTER — Ambulatory Visit: Payer: BC Managed Care – PPO | Admitting: Surgery

## 2021-01-24 ENCOUNTER — Ambulatory Visit: Payer: BC Managed Care – PPO | Admitting: Surgery

## 2021-01-31 IMAGING — CT CT ABD-PEL WO/W CM
2 of 8 series · 13 of 46 positions shown, 18 images · IV contrast (omnipaque)
Comparison: None.

CLINICAL DATA: Right lower quadrant pain x1 year

EXAM:
CT ABDOMEN AND PELVIS WITHOUT AND WITH CONTRAST
TECHNIQUE: Multidetector CT imaging of the abdomen and pelvis was performed
following the standard protocol before and following the bolus
administration of intravenous contrast.
CONTRAST:  100mL OMNIPAQUE IOHEXOL 300 MG/ML  SOLN

[Series 2: abd pelvis pre 5.00 · axial · non-contrast · 0.75mm/px · z∈[-1580,-1175]mm · 10 of 99 slices shown, 15 images]
[im 9/99  soft-tissue]
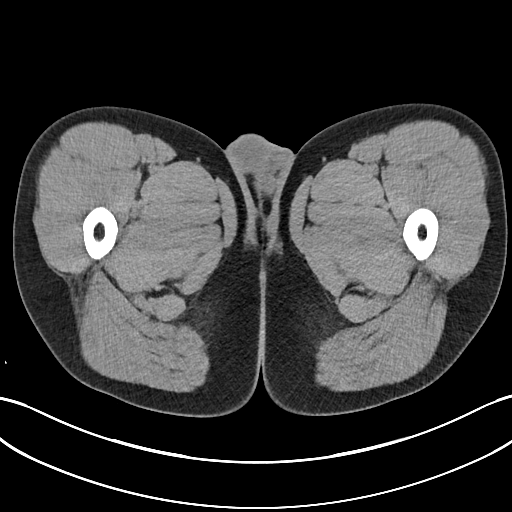
[im 9/99  bone]
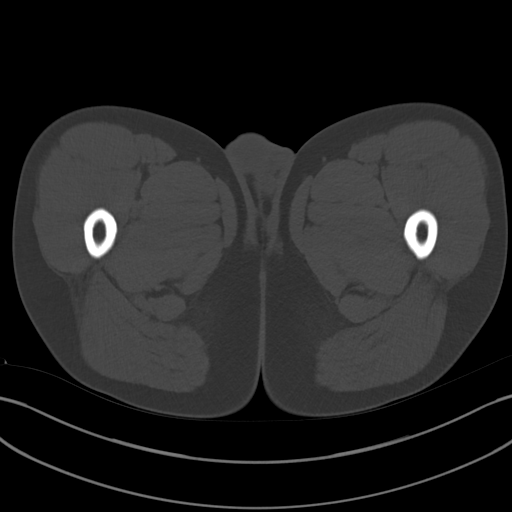
[im 17/99  soft-tissue]
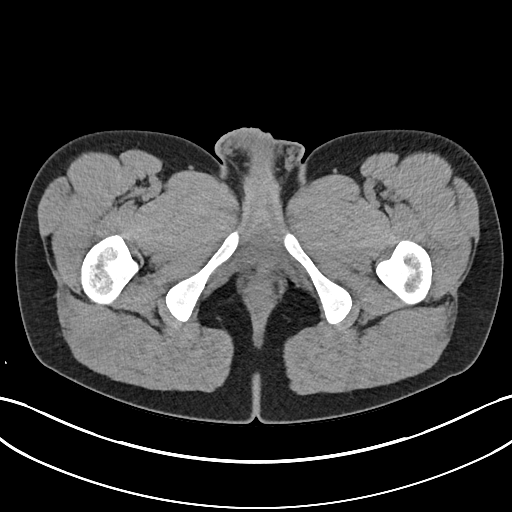
[im 33/99  soft-tissue]
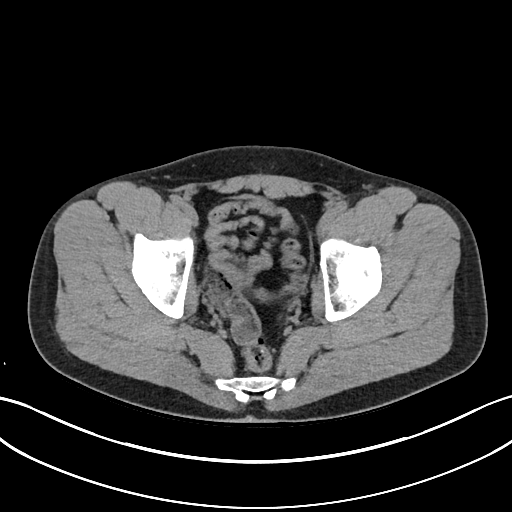
[im 41/99  soft-tissue]
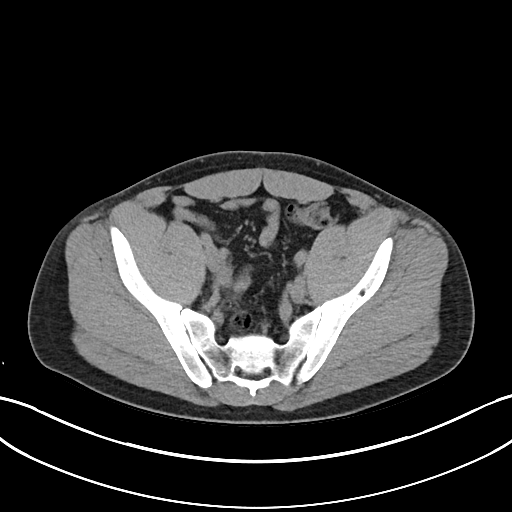
[im 50/99  soft-tissue]
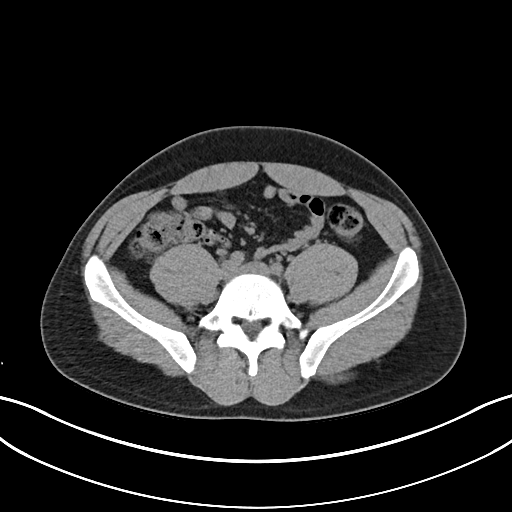
[im 58/99  soft-tissue]
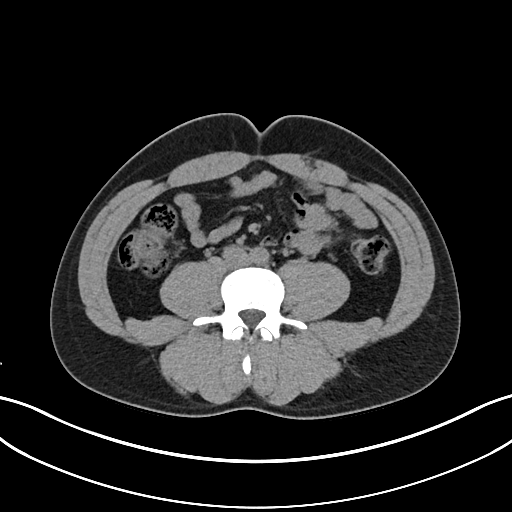
[im 66/99  soft-tissue]
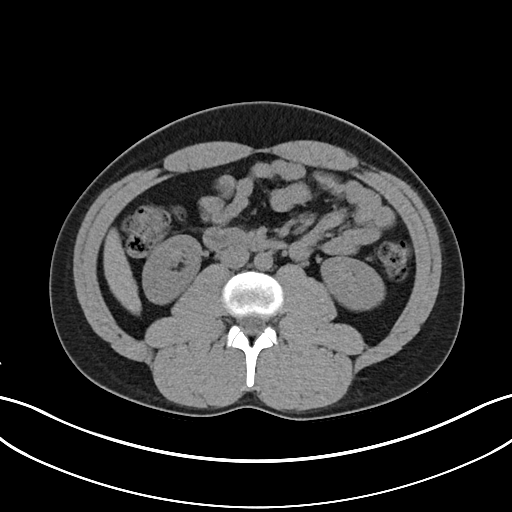
[im 66/99  lung]
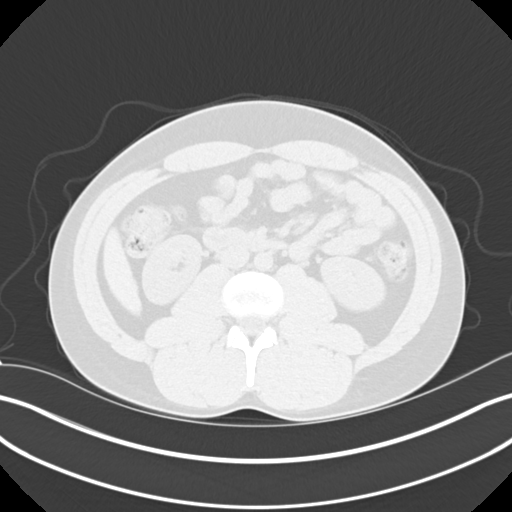
[im 74/99  lung]
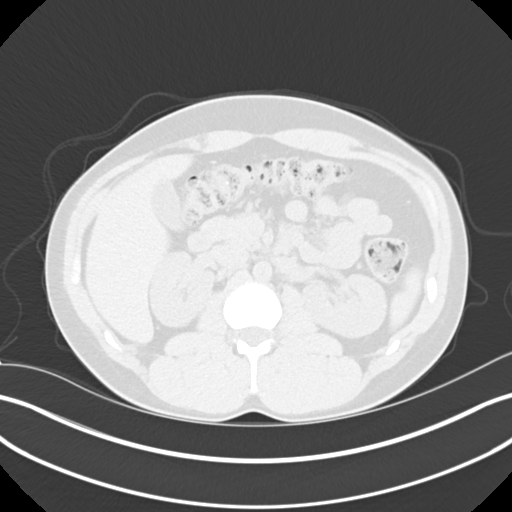
[im 82/99  soft-tissue]
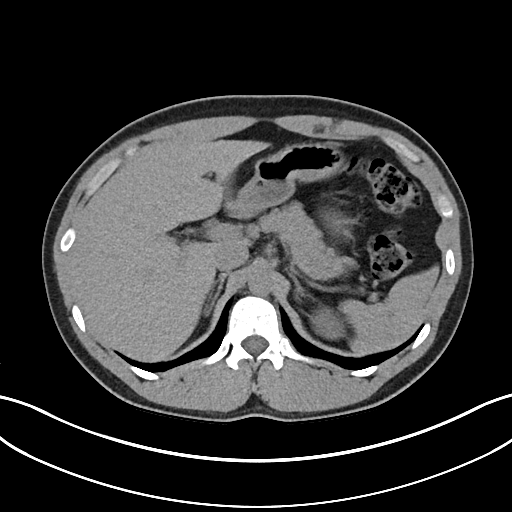
[im 82/99  lung]
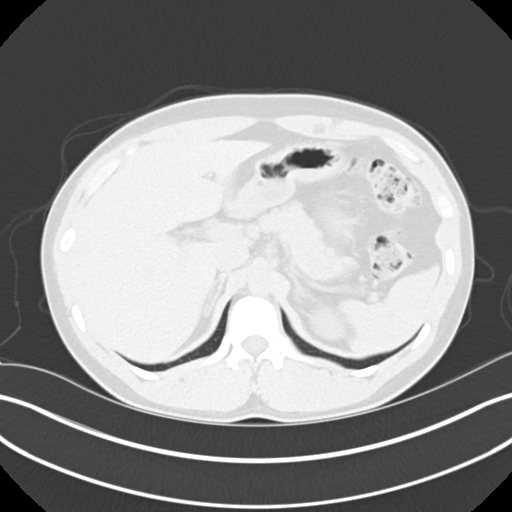
[im 90/99  soft-tissue]
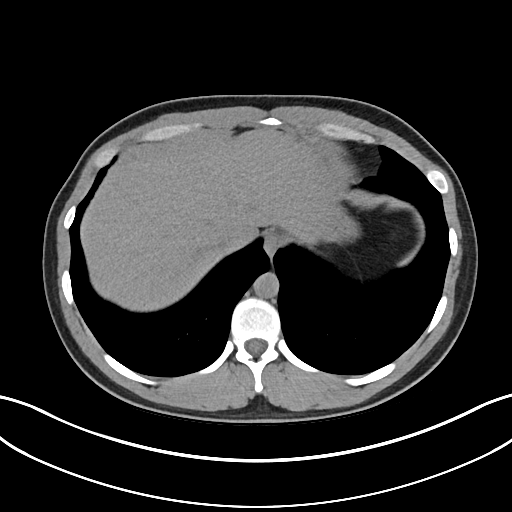
[im 90/99  lung]
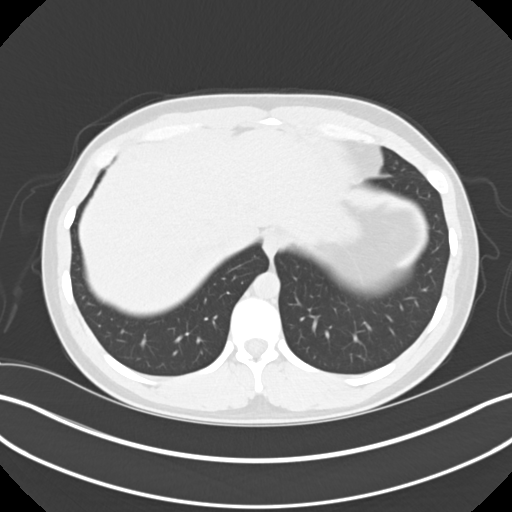
[im 90/99  bone]
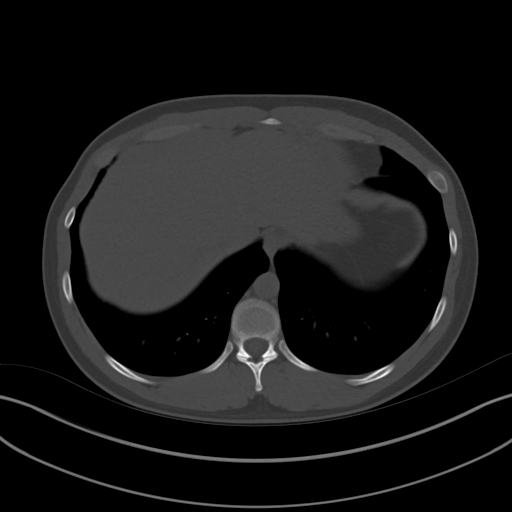

[Series 4: abd pelvis pre 2.00 cor · coronal · non-contrast · 0.74mm/px · 3 of 134 slices shown]
[im 34/134  soft-tissue]
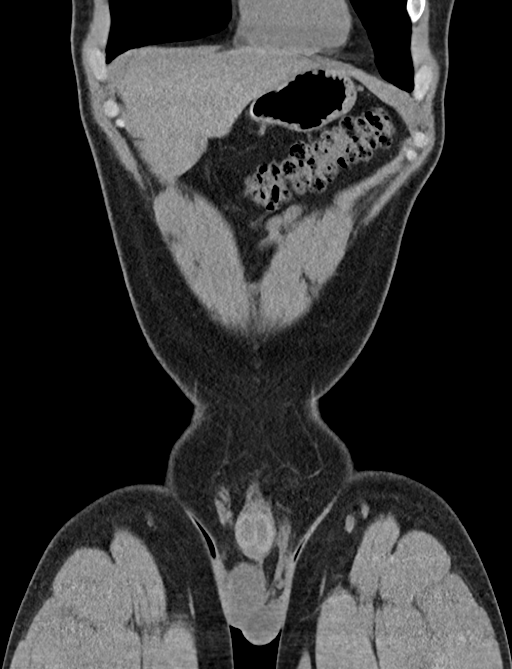
[im 67/134  soft-tissue]
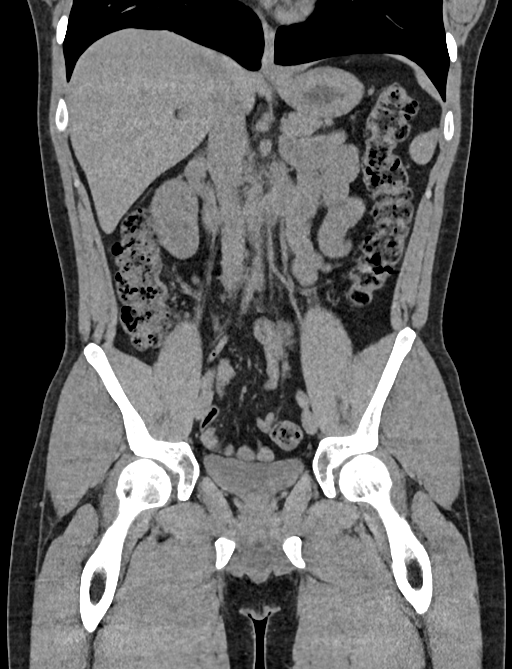
[im 100/134  soft-tissue]
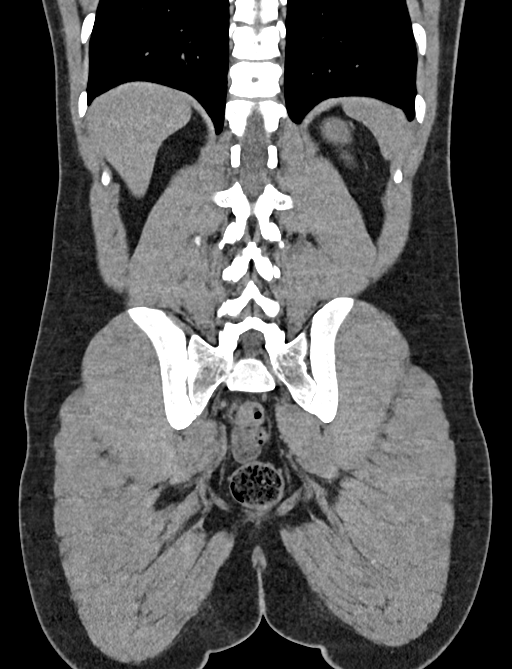

[13 of 46 positions shown; findings below may reference images not displayed]

FINDINGS: Lower chest: Lung bases are clear.

Hepatobiliary: Liver is within normal limits. No
suspicious/enhancing hepatic lesions.

Gallbladder is unremarkable. No intrahepatic or extrahepatic ductal
dilatation.

Pancreas: Within normal limits.

Spleen: Within normal limits.

Adrenals/Urinary Tract: Adrenal glands are within normal limits.

Kidneys are within normal limits. No enhancing renal lesions. No
renal, ureteral, or bladder calculi. No hydronephrosis.

Bladder is within normal limits.

Stomach/Bowel: Stomach is within normal limits.

No evidence of bowel obstruction.

Normal appendix (series 9/image 55).

Vascular/Lymphatic: No evidence of abdominal aortic aneurysm.

No suspicious abdominopelvic lymphadenopathy.

Reproductive: Prostate is unremarkable.

Other: No abdominopelvic ascites.

No evidence of ventral or inguinal hernia.

Musculoskeletal: Visualized osseous structures are within normal
limits.
IMPRESSION: Negative CT abdomen/pelvis.

## 2022-02-10 ENCOUNTER — Other Ambulatory Visit: Payer: Self-pay

## 2022-02-10 ENCOUNTER — Emergency Department (HOSPITAL_COMMUNITY): Payer: Self-pay

## 2022-02-10 ENCOUNTER — Encounter (HOSPITAL_COMMUNITY): Payer: Self-pay | Admitting: Anesthesiology

## 2022-02-10 ENCOUNTER — Encounter (HOSPITAL_COMMUNITY): Admission: EM | Disposition: A | Discharge status: Home / Self Care | Payer: Self-pay | Source: Home / Self Care

## 2022-02-10 ENCOUNTER — Inpatient Hospital Stay (HOSPITAL_COMMUNITY)
Admission: EM | Admit: 2022-02-10 | Discharge: 2022-02-13 | DRG: 329 | Disposition: A | Discharge status: Home / Self Care | Payer: Self-pay | Attending: General Surgery | Admitting: General Surgery

## 2022-02-10 DIAGNOSIS — T183XXA Foreign body in small intestine, initial encounter: Secondary | ICD-10-CM | POA: Diagnosis present

## 2022-02-10 DIAGNOSIS — W448XXA Other foreign body entering into or through a natural orifice, initial encounter: Secondary | ICD-10-CM | POA: Diagnosis present

## 2022-02-10 DIAGNOSIS — Z9889 Other specified postprocedural states: Secondary | ICD-10-CM

## 2022-02-10 DIAGNOSIS — K631 Perforation of intestine (nontraumatic): Secondary | ICD-10-CM | POA: Diagnosis present

## 2022-02-10 DIAGNOSIS — Z8616 Personal history of COVID-19: Secondary | ICD-10-CM

## 2022-02-10 DIAGNOSIS — Q430 Meckel's diverticulum (displaced) (hypertrophic): Principal | ICD-10-CM

## 2022-02-10 HISTORY — PX: LAPAROTOMY: SHX154

## 2022-02-10 LAB — URINALYSIS, ROUTINE W REFLEX MICROSCOPIC
Bilirubin Urine: NEGATIVE
Glucose, UA: NEGATIVE mg/dL
Hgb urine dipstick: NEGATIVE
Ketones, ur: 5 mg/dL — AB
Leukocytes,Ua: NEGATIVE
Nitrite: NEGATIVE
Protein, ur: NEGATIVE mg/dL
Specific Gravity, Urine: 1.009 (ref 1.005–1.030)
pH: 6 (ref 5.0–8.0)

## 2022-02-10 LAB — I-STAT CHEM 8, ED
BUN: 13 mg/dL (ref 6–20)
Calcium, Ion: 1.17 mmol/L (ref 1.15–1.40)
Chloride: 102 mmol/L (ref 98–111)
Creatinine, Ser: 1 mg/dL (ref 0.61–1.24)
Glucose, Bld: 94 mg/dL (ref 70–99)
HCT: 43 % (ref 39.0–52.0)
Hemoglobin: 14.6 g/dL (ref 13.0–17.0)
Potassium: 4 mmol/L (ref 3.5–5.1)
Sodium: 139 mmol/L (ref 135–145)
TCO2: 24 mmol/L (ref 22–32)

## 2022-02-10 LAB — CBC
HCT: 42.5 % (ref 39.0–52.0)
Hemoglobin: 15.2 g/dL (ref 13.0–17.0)
MCH: 31.1 pg (ref 26.0–34.0)
MCHC: 35.8 g/dL (ref 30.0–36.0)
MCV: 86.9 fL (ref 80.0–100.0)
Platelets: 276 10*3/uL (ref 150–400)
RBC: 4.89 MIL/uL (ref 4.22–5.81)
RDW: 12.1 % (ref 11.5–15.5)
WBC: 10.7 10*3/uL — ABNORMAL HIGH (ref 4.0–10.5)
nRBC: 0 % (ref 0.0–0.2)

## 2022-02-10 SURGERY — LAPAROTOMY, EXPLORATORY
Anesthesia: General | Site: Abdomen

## 2022-02-10 MED ORDER — PIPERACILLIN-TAZOBACTAM 3.375 G IVPB 30 MIN
3.3750 g | Freq: Once | INTRAVENOUS | Status: AC
  Administered 2022-02-10: 3.375 g via INTRAVENOUS
  Filled 2022-02-10: qty 50

## 2022-02-10 MED ORDER — PROPOFOL 10 MG/ML IV BOLUS
INTRAVENOUS | Status: AC
  Filled 2022-02-10: qty 20

## 2022-02-10 MED ORDER — DEXAMETHASONE SODIUM PHOSPHATE 10 MG/ML IJ SOLN
INTRAMUSCULAR | Status: AC
  Filled 2022-02-10: qty 1

## 2022-02-10 MED ORDER — SODIUM CHLORIDE 0.9 % IV BOLUS
1000.0000 mL | Freq: Once | INTRAVENOUS | Status: AC
  Administered 2022-02-10: 1000 mL via INTRAVENOUS

## 2022-02-10 MED ORDER — LIDOCAINE 2% (20 MG/ML) 5 ML SYRINGE
INTRAMUSCULAR | Status: AC
  Filled 2022-02-10: qty 5

## 2022-02-10 MED ORDER — ROCURONIUM BROMIDE 10 MG/ML (PF) SYRINGE
PREFILLED_SYRINGE | INTRAVENOUS | Status: AC
  Filled 2022-02-10: qty 10

## 2022-02-10 MED ORDER — FENTANYL CITRATE (PF) 250 MCG/5ML IJ SOLN
INTRAMUSCULAR | Status: AC
  Filled 2022-02-10: qty 5

## 2022-02-10 MED ORDER — IOHEXOL 350 MG/ML SOLN
75.0000 mL | Freq: Once | INTRAVENOUS | Status: AC | PRN
  Administered 2022-02-10: 75 mL via INTRAVENOUS

## 2022-02-10 MED ORDER — SUCCINYLCHOLINE CHLORIDE 200 MG/10ML IV SOSY
PREFILLED_SYRINGE | INTRAVENOUS | Status: AC
  Filled 2022-02-10: qty 10

## 2022-02-10 MED ORDER — ONDANSETRON HCL 4 MG/2ML IJ SOLN
INTRAMUSCULAR | Status: AC
  Filled 2022-02-10: qty 2

## 2022-02-10 MED ORDER — MIDAZOLAM HCL 2 MG/2ML IJ SOLN
INTRAMUSCULAR | Status: AC
  Filled 2022-02-10: qty 2

## 2022-02-10 SURGICAL SUPPLY — 49 items
BAG COUNTER SPONGE SURGICOUNT (BAG) ×1 IMPLANT
BLADE CLIPPER SURG (BLADE) IMPLANT
CANISTER SUCT 3000ML PPV (MISCELLANEOUS) ×1 IMPLANT
CELLS DAT CNTRL 66122 CELL SVR (MISCELLANEOUS) ×1 IMPLANT
CHLORAPREP W/TINT 26 (MISCELLANEOUS) ×1 IMPLANT
COVER SURGICAL LIGHT HANDLE (MISCELLANEOUS) ×1 IMPLANT
DRAPE LAPAROSCOPIC ABDOMINAL (DRAPES) ×1 IMPLANT
DRAPE WARM FLUID 44X44 (DRAPES) ×1 IMPLANT
DRSG OPSITE POSTOP 4X10 (GAUZE/BANDAGES/DRESSINGS) IMPLANT
DRSG OPSITE POSTOP 4X8 (GAUZE/BANDAGES/DRESSINGS) IMPLANT
ELECT BLADE 6.5 EXT (BLADE) IMPLANT
ELECT CAUTERY BLADE 6.4 (BLADE) ×1 IMPLANT
ELECT REM PT RETURN 9FT ADLT (ELECTROSURGICAL) ×1
ELECTRODE REM PT RTRN 9FT ADLT (ELECTROSURGICAL) ×1 IMPLANT
GLOVE BIOGEL M STRL SZ7.5 (GLOVE) ×1 IMPLANT
GLOVE BIOGEL PI IND STRL 6.5 (GLOVE) IMPLANT
GLOVE BIOGEL PI IND STRL 7.0 (GLOVE) IMPLANT
GLOVE INDICATOR 8.0 STRL GRN (GLOVE) ×2 IMPLANT
GOWN STRL REUS W/ TWL LRG LVL3 (GOWN DISPOSABLE) ×1 IMPLANT
GOWN STRL REUS W/TWL 2XL LVL3 (GOWN DISPOSABLE) ×1 IMPLANT
GOWN STRL REUS W/TWL LRG LVL3 (GOWN DISPOSABLE) ×1
HANDLE SUCTION POOLE (INSTRUMENTS) ×1 IMPLANT
KIT BASIN OR (CUSTOM PROCEDURE TRAY) ×1 IMPLANT
KIT TURNOVER KIT B (KITS) ×1 IMPLANT
LIGASURE IMPACT 36 18CM CVD LR (INSTRUMENTS) IMPLANT
NS IRRIG 1000ML POUR BTL (IV SOLUTION) ×2 IMPLANT
PACK GENERAL/GYN (CUSTOM PROCEDURE TRAY) ×1 IMPLANT
PAD ARMBOARD 7.5X6 YLW CONV (MISCELLANEOUS) ×1 IMPLANT
PENCIL SMOKE EVACUATOR (MISCELLANEOUS) ×1 IMPLANT
RELOAD STAPLE 60 2.6 WHT THN (STAPLE) IMPLANT
RELOAD STAPLER WHITE 60MM (STAPLE) ×1 IMPLANT
RETRACTOR WND ALEXIS 18 MED (MISCELLANEOUS) IMPLANT
RTRCTR WOUND ALEXIS 18CM MED (MISCELLANEOUS) ×1
SPECIMEN JAR LARGE (MISCELLANEOUS) IMPLANT
SPONGE T-LAP 18X18 ~~LOC~~+RFID (SPONGE) IMPLANT
STAPLE ECHEON FLEX 60 POW ENDO (STAPLE) IMPLANT
STAPLER RELOAD WHITE 60MM (STAPLE) ×1
STAPLER VISISTAT 35W (STAPLE) ×1 IMPLANT
SUCTION POOLE HANDLE (INSTRUMENTS) ×1
SUT PDS AB 1 CTX 36 (SUTURE) IMPLANT
SUT PDS AB 1 TP1 96 (SUTURE) ×2 IMPLANT
SUT SILK 2 0 SH CR/8 (SUTURE) ×1 IMPLANT
SUT SILK 2 0 TIES 10X30 (SUTURE) ×1 IMPLANT
SUT SILK 3 0 SH CR/8 (SUTURE) ×1 IMPLANT
SUT SILK 3 0 TIES 10X30 (SUTURE) ×1 IMPLANT
SUT VIC AB 3-0 SH 18 (SUTURE) IMPLANT
TOWEL GREEN STERILE (TOWEL DISPOSABLE) ×1 IMPLANT
TRAY FOLEY MTR SLVR 16FR STAT (SET/KITS/TRAYS/PACK) IMPLANT
YANKAUER SUCT BULB TIP NO VENT (SUCTIONS) IMPLANT

## 2022-02-10 NOTE — H&P (Signed)
Offered translating services but he declined and his wife assisted in translation when he needed additional detail  CC: lower stomach pain  Requesting provider: Dr Silverio Lay  HPI: Preston Mcgee is an 31 y.o. male who is here for evaluation for possible appendicitis.  He presented to a walk-in clinic complaining lower abdominal pain more so on the right side since Wednesday morning.  He was able to go to work that day but later in the day his pain worsened significantly and he developed a fever.  His wife gave him some medicine and the fever improved but he still continued to have abdominal pain.  No nausea or vomiting.  No diarrhea or constipation.  Last bowel movement was today.  He went to urgent care with complaints of right lower quadrant pain and was sent here for further evaluation for possible appendicitis.  Imaging here revealed a foreign object penetrating the small bowel wall with localized inflammation consistent with small bowel perforation contained.  Denies any prior surgery.  He states that he had some right lower quadrant pain about a year and a half ago and had imaging and saw a few surgeons and he is wondering if this could be a sequela of that.  No weight loss.  No melena or hematochezia.  He is a [employer].  He cannot remember ingesting anything abnormal.  He denies any daily medications.  He denies any tobacco, alcohol or drug use.  Past Medical History:  Diagnosis Date   COVID Dec 2019-2020   during COVID loss of smell/ taste, fever, HA, "breaking bones" .   After COVID: smell is still reduced     History reviewed. No pertinent surgical history.  History reviewed. No pertinent family history.  Social:  reports that he has never smoked. He has never used smokeless tobacco. He reports current alcohol use. He reports that he does not use drugs.  Allergies: No Known Allergies  Medications: I have reviewed the patient's current medications.   ROS - all  of the below systems have been reviewed with the patient and positives are indicated with bold text General: chills, fever or night sweats Eyes: blurry vision or double vision ENT: epistaxis or sore throat Allergy/Immunology: itchy/watery eyes or nasal congestion Hematologic/Lymphatic: bleeding problems, blood clots or swollen lymph nodes Endocrine: temperature intolerance or unexpected weight changes Breast: new or changing breast lumps or nipple discharge Resp: cough, shortness of breath, or wheezing CV: chest pain or dyspnea on exertion GI: as per HPI GU: dysuria, trouble voiding, or hematuria MSK: joint pain or joint stiffness Neuro: TIA or stroke symptoms Derm: pruritus and skin lesion changes Psych: anxiety and depression  PE Blood pressure 104/65, pulse 89, temperature 98.4 F (36.9 C), resp. rate 18, SpO2 99 %. Constitutional: NAD; conversant; no deformities, resting comfortably, not ill appearing Eyes: Moist conjunctiva; no lid lag; anicteric; PERRL Neck: Trachea midline; no thyromegaly Lungs: Normal respiratory effort; no tactile fremitus CV: RRR; no palpable thrills; no pitting edema GI: Abd soft, nd, TTP in lower midline, some voluntary guarding; no palpable hepatosplenomegaly MSK: Normal gait; no clubbing/cyanosis Psychiatric: Appropriate affect; alert and oriented x3 Lymphatic: No palpable cervical or axillary lymphadenopathy Skin:no rash/lesions/jaundice  Results for orders placed or performed during the hospital encounter of 02/10/22 (from the past 48 hour(s))  Urinalysis, Routine w reflex microscopic Urine, Clean Catch     Status: Abnormal   Collection Time: 02/10/22  6:43 PM  Result Value Ref Range   Color, Urine  STRAW (A) YELLOW   APPearance CLEAR CLEAR   Specific Gravity, Urine 1.009 1.005 - 1.030   pH 6.0 5.0 - 8.0   Glucose, UA NEGATIVE NEGATIVE mg/dL   Hgb urine dipstick NEGATIVE NEGATIVE   Bilirubin Urine NEGATIVE NEGATIVE   Ketones, ur 5 (A) NEGATIVE  mg/dL   Protein, ur NEGATIVE NEGATIVE mg/dL   Nitrite NEGATIVE NEGATIVE   Leukocytes,Ua NEGATIVE NEGATIVE    Comment: Performed at [facility], [address], [place], [place] [address]  CBC     Status: Abnormal   Collection Time: 02/10/22  8:09 PM  Result Value Ref Range   WBC 10.7 (H) 4.0 - 10.5 K/uL   RBC 4.89 4.22 - 5.81 MIL/uL   Hemoglobin 15.2 13.0 - 17.0 g/dL   HCT 58.0 99.8 - 33.8 %   MCV 86.9 80.0 - 100.0 fL   MCH 31.1 26.0 - 34.0 pg   MCHC 35.8 30.0 - 36.0 g/dL   RDW 25.0 53.9 - 76.7 %   Platelets 276 150 - 400 K/uL   nRBC 0.0 0.0 - 0.2 %    Comment: Performed at [facility], [address] [place], [place] [address]  I-stat chem 8, ED (not at [facility], [facility] or [facility])     Status: None   Collection Time: 02/10/22  9:12 PM  Result Value Ref Range   Sodium 139 135 - 145 mmol/L   Potassium 4.0 3.5 - 5.1 mmol/L   Chloride 102 98 - 111 mmol/L   BUN 13 6 - 20 mg/dL   Creatinine, Ser 7.90 0.61 - 1.24 mg/dL   Glucose, Bld 94 70 - 99 mg/dL    Comment: Glucose reference range applies only to samples taken after fasting for at least 8 hours.   Calcium, Ion 1.17 1.15 - 1.40 mmol/L   TCO2 24 22 - 32 mmol/L   Hemoglobin 14.6 13.0 - 17.0 g/dL   HCT 24.0 97.3 - 53.2 %    CT ABDOMEN PELVIS W CONTRAST  Result Date: 02/10/2022 CLINICAL DATA:  Right lower quadrant abdominal pain EXAM: CT ABDOMEN AND PELVIS WITH CONTRAST TECHNIQUE: Multidetector CT imaging of the abdomen and pelvis was performed using the standard protocol following bolus administration of intravenous contrast. RADIATION DOSE REDUCTION: This exam was performed according to the departmental dose-optimization program which includes automated exposure control, adjustment of the mA and/or kV according to patient size and/or use of iterative reconstruction technique. CONTRAST:  45mL OMNIPAQUE IOHEXOL 350 MG/ML SOLN COMPARISON:  04/30/2019 FINDINGS: Lower chest: No acute abnormality. Hepatobiliary: No focal liver  abnormality is seen. No gallstones, gallbladder wall thickening, or biliary dilatation. Pancreas: Unremarkable Spleen: Unremarkable Adrenals/Urinary Tract: Adrenal glands are unremarkable. Kidneys are normal, without renal calculi, focal lesion, or hydronephrosis. Bladder is unremarkable. Stomach/Bowel: A 2 cm wire like radiodense foreign body is seen within the mid small bowel within the right hypogastric region best seen on axial image # 64/3 and coronal image 40-47/6, possibly representing an ingested bone or wire bristle. The foreign body appears to penetrate the bowel wall and there is extensive adjacent mesenteric inflammatory stranding, best seen on image # 63/3. No loculated intra-abdominal fluid collections are identified. There is no free intraperitoneal gas or fluid. The stomach, small bowel, and large bowel are otherwise unremarkable. The appendix is normal. Vascular/Lymphatic: No significant vascular findings are present. No enlarged abdominal or pelvic lymph nodes. Reproductive: Prostate is unremarkable. Other: No abdominal wall hernia Musculoskeletal: No acute or significant osseous findings. IMPRESSION: 1.  2 cm wire like radiodense foreign body within the mid small bowel within the right hypogastric region, possibly representing an ingested bone or wire bristle. The foreign body appears to penetrate the bowel wall and there is extensive adjacent mesenteric inflammatory stranding. No loculated intra-abdominal fluid collections are identified. No free intraperitoneal gas or fluid. Electronically Signed   By: Helyn Numbers M.D.   On: 02/10/2022 22:20    Imaging: Personally reviewed CT 02/10/22 & 04/30/19  A/P: Dane Kopke is an 31 y.o. male with contained small bowel perforation due to foreign object  .unknown object  IV abx  Recommended exp lap with small bowel resection; no viable non-operative management option  I discussed the procedure in detail.  We discussed the risks  and benefits of surgery including, but not limited to bleeding, infection (such as wound infection, abdominal abscess), injury to surrounding structures, blood clot formation, urinary retention, incisional hernia, anastomotic stricture, anastomotic leak, anesthesia risks, open wound, pulmonary & cardiac complications such as pneumonia &/or heart attack, need for additional procedures, ileus, & prolonged hospitalization.  We discussed the typical postoperative recovery course, including limitations & restrictions postoperatively. I explained that the likelihood of improvement in their symptoms is good.  Benefits of surgery - prevention of ongoing abd pain, worsening perforation/fistula  He has agreed to proceed with surgery   I reviewed the ER attending note, labs from today, CT scan from today as well as February 2021, Dr. Everlene Farrier consult note Jul 05, 2020 and 06/14/2020  Mary Sella. Andrey Campanile, MD, FACS General, Bariatric, & Minimally Invasive Surgery [facility]

## 2022-02-10 NOTE — Anesthesia Preprocedure Evaluation (Signed)
Anesthesia Evaluation  Patient identified by MRN, date of birth, ID band Patient awake    Reviewed: Allergy & Precautions, NPO status , Patient's Chart, lab work & pertinent test results  Airway Mallampati: II  TM Distance: >3 FB Neck ROM: Full    Dental  (+) Teeth Intact, Dental Advisory Given   Pulmonary neg pulmonary ROS   breath sounds clear to auscultation       Cardiovascular negative cardio ROS  Rhythm:Regular Rate:Normal     Neuro/Psych negative neurological ROS     GI/Hepatic Neg liver ROS,,,  Endo/Other  negative endocrine ROS    Renal/GU negative Renal ROS     Musculoskeletal   Abdominal   Peds  Hematology negative hematology ROS (+)   Anesthesia Other Findings   Reproductive/Obstetrics                             Anesthesia Physical Anesthesia Plan  ASA: 2 and emergent  Anesthesia Plan: General   Post-op Pain Management: Precedex, Ofirmev IV (intra-op)* and Toradol IV (intra-op)*   Induction: Intravenous, Rapid sequence and Cricoid pressure planned  PONV Risk Score and Plan: 3 and Ondansetron, Dexamethasone and Midazolam  Airway Management Planned: Oral ETT  Additional Equipment: None  Intra-op Plan:   Post-operative Plan: Extubation in OR  Informed Consent: I have reviewed the patients History and Physical, chart, labs and discussed the procedure including the risks, benefits and alternatives for the proposed anesthesia with the patient or authorized representative who has indicated his/her understanding and acceptance.     Dental advisory given  Plan Discussed with: CRNA  Anesthesia Plan Comments:        Anesthesia Quick Evaluation

## 2022-02-10 NOTE — ED Triage Notes (Addendum)
Patient reports persistent pain across lower abdomen onset Wednesday this week , seen at a walk-in clinic advised him to go to ER to rule out appendicitis , he adds mild fever , no emesis or diarrhea .

## 2022-02-10 NOTE — ED Provider Notes (Signed)
[facility] EMERGENCY DEPARTMENT Provider Note   CSN: [id number] Arrival date & time: 02/10/22  1939     History  Chief Complaint  Patient presents with   Abdominal Pain r/o Appendicitis    Preston Mcgee is a 31 y.o. male here presenting with lower abdominal pain.  Patient has been having lower abdominal cramps for the last 2 days.  Patient states that it is cramping in nature.  Patient has some nausea and vomiting.  He had an episode of fever 2 days ago.  The history is provided by the patient.       Home Medications Prior to Admission medications   Medication Sig Start Date End Date Taking? Authorizing Provider  cephALEXin (KEFLEX) 500 MG capsule Take 1 capsule (500 mg total) by mouth 3 (three) times daily. 12/15/20   Sharman Cheek, MD      Allergies    Patient has no known allergies.    Review of Systems   Review of Systems  Gastrointestinal:  Positive for abdominal pain and vomiting.  All other systems reviewed and are negative.   Physical Exam Updated Vital Signs BP 126/82   Pulse 80   Temp 98.4 F (36.9 C)   Resp 16   SpO2 99%  Physical Exam Vitals and nursing note reviewed.  Constitutional:      Appearance: Normal appearance.  HENT:     Head: Normocephalic.     Nose: Nose normal.     Mouth/Throat:     Mouth: Mucous membranes are moist.  Eyes:     Extraocular Movements: Extraocular movements intact.     Pupils: Pupils are equal, round, and reactive to light.  Cardiovascular:     Rate and Rhythm: Normal rate and regular rhythm.  Pulmonary:     Effort: Pulmonary effort is normal.     Breath sounds: Normal breath sounds.  Abdominal:     General: Abdomen is flat.     Comments: Mild diffuse lower abdominal tenderness worse in the right lower quadrant.  No rebound or guarding  Musculoskeletal:        General: Normal range of motion.     Cervical back: Normal range of motion and neck supple.  Skin:    General:  Skin is warm.     Capillary Refill: Capillary refill takes less than 2 seconds.  Neurological:     General: No focal deficit present.     Mental Status: He is alert and oriented to person, place, and time.  Psychiatric:        Mood and Affect: Mood normal.        Behavior: Behavior normal.     ED Results / Procedures / Treatments   Labs (all labs ordered are listed, but only abnormal results are displayed) Labs Reviewed  CBC - Abnormal; Notable for the following components:      Result Value   WBC 10.7 (*)    All other components within normal limits  URINALYSIS, ROUTINE W REFLEX MICROSCOPIC - Abnormal; Notable for the following components:   Color, Urine STRAW (*)    Ketones, ur 5 (*)    All other components within normal limits  BASIC METABOLIC PANEL  I-STAT CHEM 8, ED    EKG None  Radiology CT ABDOMEN PELVIS W CONTRAST  Result Date: 02/10/2022 CLINICAL DATA:  Right lower quadrant abdominal pain EXAM: CT ABDOMEN AND PELVIS WITH CONTRAST TECHNIQUE: Multidetector CT imaging of the abdomen and pelvis was performed using the standard  protocol following bolus administration of intravenous contrast. RADIATION DOSE REDUCTION: This exam was performed according to the departmental dose-optimization program which includes automated exposure control, adjustment of the mA and/or kV according to patient size and/or use of iterative reconstruction technique. CONTRAST:  39mL OMNIPAQUE IOHEXOL 350 MG/ML SOLN COMPARISON:  04/30/2019 FINDINGS: Lower chest: No acute abnormality. Hepatobiliary: No focal liver abnormality is seen. No gallstones, gallbladder wall thickening, or biliary dilatation. Pancreas: Unremarkable Spleen: Unremarkable Adrenals/Urinary Tract: Adrenal glands are unremarkable. Kidneys are normal, without renal calculi, focal lesion, or hydronephrosis. Bladder is unremarkable. Stomach/Bowel: A 2 cm wire like radiodense foreign body is seen within the mid small bowel within the right  hypogastric region best seen on axial image # 64/3 and coronal image 40-47/6, possibly representing an ingested bone or wire bristle. The foreign body appears to penetrate the bowel wall and there is extensive adjacent mesenteric inflammatory stranding, best seen on image # 63/3. No loculated intra-abdominal fluid collections are identified. There is no free intraperitoneal gas or fluid. The stomach, small bowel, and large bowel are otherwise unremarkable. The appendix is normal. Vascular/Lymphatic: No significant vascular findings are present. No enlarged abdominal or pelvic lymph nodes. Reproductive: Prostate is unremarkable. Other: No abdominal wall hernia Musculoskeletal: No acute or significant osseous findings. IMPRESSION: 1. 2 cm wire like radiodense foreign body within the mid small bowel within the right hypogastric region, possibly representing an ingested bone or wire bristle. The foreign body appears to penetrate the bowel wall and there is extensive adjacent mesenteric inflammatory stranding. No loculated intra-abdominal fluid collections are identified. No free intraperitoneal gas or fluid. Electronically Signed   By: Helyn Numbers M.D.   On: 02/10/2022 22:20    Procedures Procedures    CRITICAL CARE Performed by: Richardean Canal   Total critical care time: 30 minutes  Critical care time was exclusive of separately billable procedures and treating other patients.  Critical care was necessary to treat or prevent imminent or life-threatening deterioration.  Critical care was time spent personally by me on the following activities: development of treatment plan with patient and/or surrogate as well as nursing, discussions with consultants, evaluation of patient's response to treatment, examination of patient, obtaining history from patient or surrogate, ordering and performing treatments and interventions, ordering and review of laboratory studies, ordering and review of radiographic studies,  pulse oximetry and re-evaluation of patient's condition.   Medications Ordered in ED Medications  piperacillin-tazobactam (ZOSYN) IVPB 3.375 g (has no administration in time range)  sodium chloride 0.9 % bolus 1,000 mL (1,000 mLs Intravenous New Bag/Given 02/10/22 2107)  iohexol (OMNIPAQUE) 350 MG/ML injection 75 mL (75 mLs Intravenous Contrast Given 02/10/22 2208)    ED Course/ Medical Decision Making/ A&P                           Medical Decision Making Preston Mcgee is a 31 y.o. male here presenting with lower abdominal pain.  Consider viral gastroenteritis versus appendicitis.  Plan to get CBC and CMP and UA and CT abdomen pelvis  10:52 PM White blood cell count is 11.  CT showed 2 cm radiodense foreign body with perforation of the bowel wall with mesenteric inflammatory stranding.  There is no intra-abdominal fluid collection visible.  I am concerned for possible bowel perforation from bone.  Patient adamantly denies ingesting any wires or foreign bodies.  Patient is not clear if he ingested a bone recently.  I ordered Zosyn.  I consulted Dr. Andrey Campanile from surgery to see patient.    Problems Addressed: Bowel perforation (HCC): acute illness or injury  Amount and/or Complexity of Data Reviewed Labs: ordered. Decision-making details documented in ED Course. Radiology: ordered and independent interpretation performed. Decision-making details documented in ED Course.  Risk Prescription drug management.   Final Clinical Impression(s) / ED Diagnoses Final diagnoses:  None    Rx / DC Orders ED Discharge Orders     None         Charlynne Pander, MD 02/10/22 2254

## 2022-02-10 NOTE — ED Provider Triage Note (Signed)
Emergency Medicine Provider Triage Evaluation Note  Tirso Laws , a 31 y.o. male  was evaluated in triage.  Pt complains of right lower quadrant abdominal pain since Wednesday.  Patient states pain is steadily increasing since this time.  The patient reports that he had a fever yesterday along with nausea and vomiting.  Patient denies any overt dysuria however states that he "could not tell when he had used the bathroom until the pain started".  Patient denies history of kidney stones.  Review of Systems  Positive:  Negative:   Physical Exam  BP 126/82   Pulse 80   Temp 98.4 F (36.9 C)   Resp 16   SpO2 99%  Gen:   Awake, no distress   Resp:  Normal effort  MSK:   Moves extremities without difficulty  Other:    Medical Decision Making  Medically screening exam initiated at 8:03 PM.  Appropriate orders placed.  Rhona Leavens Ardyth Harps was informed that the remainder of the evaluation will be completed by another provider, this initial triage assessment does not replace that evaluation, and the importance of remaining in the ED until their evaluation is complete.     Al Decant, PA-C 02/10/22 2004

## 2022-02-11 ENCOUNTER — Emergency Department (HOSPITAL_COMMUNITY): Payer: Self-pay | Admitting: Anesthesiology

## 2022-02-11 ENCOUNTER — Other Ambulatory Visit: Payer: Self-pay

## 2022-02-11 DIAGNOSIS — Q430 Meckel's diverticulum (displaced) (hypertrophic): Secondary | ICD-10-CM

## 2022-02-11 DIAGNOSIS — Z9889 Other specified postprocedural states: Secondary | ICD-10-CM

## 2022-02-11 LAB — BASIC METABOLIC PANEL
Anion gap: 9 (ref 5–15)
Anion gap: 9 (ref 5–15)
BUN: 15 mg/dL (ref 6–20)
BUN: 12 mg/dL (ref 6–20)
CO2: 25 mmol/L (ref 22–32)
CO2: 25 mmol/L (ref 22–32)
Calcium: 9.4 mg/dL (ref 8.9–10.3)
Calcium: 8.6 mg/dL — ABNORMAL LOW (ref 8.9–10.3)
Chloride: 105 mmol/L (ref 98–111)
Chloride: 104 mmol/L (ref 98–111)
Creatinine, Ser: 1.1 mg/dL (ref 0.61–1.24)
Creatinine, Ser: 1.16 mg/dL (ref 0.61–1.24)
GFR, Estimated: >60 mL/min (ref >60)
GFR, Estimated: >60 mL/min (ref >60)
Glucose, Bld: 99 mg/dL (ref 70–99)
Glucose, Bld: 157 mg/dL — ABNORMAL HIGH (ref 70–99)
Potassium: 4.2 mmol/L (ref 3.5–5.1)
Potassium: 3.7 mmol/L (ref 3.5–5.1)
Sodium: 139 mmol/L (ref 135–145)
Sodium: 138 mmol/L (ref 135–145)

## 2022-02-11 LAB — CBC
HCT: 39.3 % (ref 39.0–52.0)
Hemoglobin: 14 g/dL (ref 13.0–17.0)
MCH: 30.7 pg (ref 26.0–34.0)
MCHC: 35.6 g/dL (ref 30.0–36.0)
MCV: 86.2 fL (ref 80.0–100.0)
Platelets: 254 10*3/uL (ref 150–400)
RBC: 4.56 MIL/uL (ref 4.22–5.81)
RDW: 12.3 % (ref 11.5–15.5)
WBC: 17 10*3/uL — ABNORMAL HIGH (ref 4.0–10.5)
nRBC: 0 % (ref 0.0–0.2)

## 2022-02-11 MED ORDER — METHOCARBAMOL 500 MG PO TABS: 500.0000 mg | ORAL_TABLET | Freq: Four times a day (QID) | ORAL | Status: DC | PRN

## 2022-02-11 MED ORDER — ONDANSETRON 4 MG PO TBDP: 4.0000 mg | ORAL_TABLET | Freq: Four times a day (QID) | ORAL | Status: DC | PRN

## 2022-02-11 MED ORDER — PIPERACILLIN-TAZOBACTAM 3.375 G IVPB
3.3750 g | Freq: Three times a day (TID) | INTRAVENOUS | Status: AC
  Administered 2022-02-11 – 2022-02-12 (×3): 3.375 g via INTRAVENOUS
  Filled 2022-02-11 (×3): qty 50

## 2022-02-11 MED ORDER — HYDROMORPHONE HCL 1 MG/ML IJ SOLN
INTRAMUSCULAR | Status: AC
  Filled 2022-02-11: qty 1

## 2022-02-11 MED ORDER — MORPHINE SULFATE (PF) 2 MG/ML IV SOLN
1.0000 mg | INTRAVENOUS | Status: DC | PRN
  Administered 2022-02-11: 2 mg via INTRAVENOUS
  Filled 2022-02-11: qty 1

## 2022-02-11 MED ORDER — AMISULPRIDE (ANTIEMETIC) 5 MG/2ML IV SOLN
INTRAVENOUS | Status: AC
  Filled 2022-02-11: qty 4

## 2022-02-11 MED ORDER — MIDAZOLAM HCL 2 MG/2ML IJ SOLN
INTRAMUSCULAR | Status: DC | PRN
  Administered 2022-02-11: 2 mg via INTRAVENOUS

## 2022-02-11 MED ORDER — SUGAMMADEX SODIUM 200 MG/2ML IV SOLN
INTRAVENOUS | Status: DC | PRN
  Administered 2022-02-11: 200 mg via INTRAVENOUS

## 2022-02-11 MED ORDER — PROPOFOL 10 MG/ML IV BOLUS
INTRAVENOUS | Status: DC | PRN
  Administered 2022-02-11: 140 mg via INTRAVENOUS

## 2022-02-11 MED ORDER — DIPHENHYDRAMINE HCL 50 MG/ML IJ SOLN: 12.5000 mg | Freq: Four times a day (QID) | INTRAMUSCULAR | Status: DC | PRN

## 2022-02-11 MED ORDER — 0.9 % SODIUM CHLORIDE (POUR BTL) OPTIME
TOPICAL | Status: DC | PRN
  Administered 2022-02-11: 1000 mL

## 2022-02-11 MED ORDER — SUCCINYLCHOLINE 20MG/ML (10ML) SYRINGE FOR MEDFUSION PUMP - OPTIME
INTRAMUSCULAR | Status: DC | PRN
  Administered 2022-02-11: 140 mg via INTRAVENOUS

## 2022-02-11 MED ORDER — DIPHENHYDRAMINE HCL 12.5 MG/5ML PO ELIX: 12.5000 mg | ORAL_SOLUTION | Freq: Four times a day (QID) | ORAL | Status: DC | PRN

## 2022-02-11 MED ORDER — DOCUSATE SODIUM 100 MG PO CAPS
100.0000 mg | ORAL_CAPSULE | Freq: Two times a day (BID) | ORAL | Status: DC
  Administered 2022-02-11 – 2022-02-13 (×5): 100 mg via ORAL
  Filled 2022-02-11 (×5): qty 1

## 2022-02-11 MED ORDER — ROCURONIUM 10MG/ML (10ML) SYRINGE FOR MEDFUSION PUMP - OPTIME
INTRAVENOUS | Status: DC | PRN
  Administered 2022-02-11: 50 mg via INTRAVENOUS

## 2022-02-11 MED ORDER — MEPERIDINE HCL 25 MG/ML IJ SOLN: 6.2500 mg | INTRAMUSCULAR | Status: DC | PRN

## 2022-02-11 MED ORDER — ACETAMINOPHEN 10 MG/ML IV SOLN
1000.0000 mg | Freq: Once | INTRAVENOUS | Status: DC | PRN
  Administered 2022-02-11: 1000 mg via INTRAVENOUS

## 2022-02-11 MED ORDER — PROMETHAZINE HCL 25 MG/ML IJ SOLN
INTRAMUSCULAR | Status: AC
  Filled 2022-02-11: qty 1

## 2022-02-11 MED ORDER — ONDANSETRON HCL 4 MG/2ML IJ SOLN: 4.0000 mg | Freq: Four times a day (QID) | INTRAMUSCULAR | Status: DC | PRN

## 2022-02-11 MED ORDER — KETOROLAC TROMETHAMINE 30 MG/ML IJ SOLN
30.0000 mg | Freq: Three times a day (TID) | INTRAMUSCULAR | Status: AC
  Administered 2022-02-11 – 2022-02-12 (×6): 30 mg via INTRAVENOUS
  Filled 2022-02-11 (×6): qty 1

## 2022-02-11 MED ORDER — LACTATED RINGERS IV SOLN: INTRAVENOUS | Status: DC

## 2022-02-11 MED ORDER — SIMETHICONE 80 MG PO CHEW: 40.0000 mg | CHEWABLE_TABLET | Freq: Four times a day (QID) | ORAL | Status: DC | PRN

## 2022-02-11 MED ORDER — ACETAMINOPHEN 325 MG PO TABS: 325.0000 mg | ORAL_TABLET | Freq: Once | ORAL | Status: DC | PRN

## 2022-02-11 MED ORDER — DEXAMETHASONE SODIUM PHOSPHATE 10 MG/ML IJ SOLN
INTRAMUSCULAR | Status: DC | PRN
  Administered 2022-02-11: 10 mg via INTRAVENOUS

## 2022-02-11 MED ORDER — LIDOCAINE HCL (CARDIAC) PF 100 MG/5ML IV SOSY
PREFILLED_SYRINGE | INTRAVENOUS | Status: DC | PRN
  Administered 2022-02-11: 40 mg via INTRAVENOUS

## 2022-02-11 MED ORDER — AMISULPRIDE (ANTIEMETIC) 5 MG/2ML IV SOLN
10.0000 mg | Freq: Once | INTRAVENOUS | Status: AC | PRN
  Administered 2022-02-11: 10 mg via INTRAVENOUS

## 2022-02-11 MED ORDER — OXYCODONE HCL 5 MG PO TABS
5.0000 mg | ORAL_TABLET | ORAL | Status: DC | PRN
  Administered 2022-02-11: 10 mg via ORAL
  Filled 2022-02-11: qty 2

## 2022-02-11 MED ORDER — KETOROLAC TROMETHAMINE 30 MG/ML IJ SOLN
INTRAMUSCULAR | Status: DC | PRN
  Administered 2022-02-11: 30 mg via INTRAVENOUS

## 2022-02-11 MED ORDER — PANTOPRAZOLE SODIUM 40 MG PO TBEC
40.0000 mg | DELAYED_RELEASE_TABLET | Freq: Every day | ORAL | Status: DC
  Administered 2022-02-11 – 2022-02-13 (×3): 40 mg via ORAL
  Filled 2022-02-11 (×3): qty 1

## 2022-02-11 MED ORDER — FENTANYL CITRATE (PF) 250 MCG/5ML IJ SOLN
INTRAMUSCULAR | Status: DC | PRN
  Administered 2022-02-11 (×2): 100 ug via INTRAVENOUS
  Administered 2022-02-11: 50 ug via INTRAVENOUS

## 2022-02-11 MED ORDER — DEXMEDETOMIDINE HCL IN NACL 80 MCG/20ML IV SOLN
INTRAVENOUS | Status: DC | PRN
  Administered 2022-02-11: 4 ug via BUCCAL
  Administered 2022-02-11: 8 ug via BUCCAL
  Administered 2022-02-11: 4 ug via BUCCAL

## 2022-02-11 MED ORDER — ACETAMINOPHEN 500 MG PO TABS
1000.0000 mg | ORAL_TABLET | Freq: Four times a day (QID) | ORAL | Status: DC
  Administered 2022-02-11 – 2022-02-13 (×5): 1000 mg via ORAL
  Filled 2022-02-11 (×8): qty 2

## 2022-02-11 MED ORDER — PROMETHAZINE HCL 25 MG/ML IJ SOLN
6.2500 mg | INTRAMUSCULAR | Status: DC | PRN
  Administered 2022-02-11: 6.25 mg via INTRAVENOUS

## 2022-02-11 MED ORDER — ACETAMINOPHEN 160 MG/5ML PO SOLN: 325.0000 mg | Freq: Once | ORAL | Status: DC | PRN

## 2022-02-11 MED ORDER — HYDROMORPHONE HCL 1 MG/ML IJ SOLN
0.2500 mg | INTRAMUSCULAR | Status: DC | PRN
  Administered 2022-02-11 (×3): 0.5 mg via INTRAVENOUS

## 2022-02-11 MED ORDER — ENOXAPARIN SODIUM 40 MG/0.4ML IJ SOSY
40.0000 mg | PREFILLED_SYRINGE | INTRAMUSCULAR | Status: DC
  Administered 2022-02-12: 40 mg via SUBCUTANEOUS
  Filled 2022-02-11: qty 0.4

## 2022-02-11 MED ORDER — HYDROMORPHONE HCL 1 MG/ML IJ SOLN: 0.5000 mg | INTRAMUSCULAR | Status: DC | PRN

## 2022-02-11 MED ORDER — KCL IN DEXTROSE-NACL 20-5-0.45 MEQ/L-%-% IV SOLN
INTRAVENOUS | Status: DC
  Filled 2022-02-11 (×2): qty 1000

## 2022-02-11 MED ORDER — ONDANSETRON HCL 4 MG/2ML IJ SOLN
INTRAMUSCULAR | Status: DC | PRN
  Administered 2022-02-11: 4 mg via INTRAVENOUS

## 2022-02-11 MED ORDER — LACTATED RINGERS IV SOLN: INTRAVENOUS | Status: DC | PRN

## 2022-02-11 NOTE — Transfer of Care (Signed)
Immediate Anesthesia Transfer of Care Note  Patient: Preston Mcgee  Procedure(s) Performed: Meckels Diverticulum with foreign body (Abdomen)  Patient Location: PACU  Anesthesia Type:General  Level of Consciousness: awake, alert , and oriented  Airway & Oxygen Therapy: Patient Spontanous Breathing  Post-op Assessment: Report given to RN and Post -op Vital signs reviewed and stable  Post vital signs: Reviewed and stable  Last Vitals:  Vitals Value Taken Time  BP 114/67 02/11/22 0137  Temp    Pulse 96 02/11/22 0140  Resp 26 02/11/22 0140  SpO2 89 % 02/11/22 0140  Vitals shown include unvalidated device data.  Last Pain:  Vitals:   02/10/22 1954  PainSc: 7          Complications: No notable events documented.

## 2022-02-11 NOTE — Anesthesia Postprocedure Evaluation (Signed)
Anesthesia Post Note  Patient: Preston Mcgee  Procedure(s) Performed: Meckels Diverticulum with foreign body (Abdomen)     Patient location during evaluation: Other Anesthesia Type: General Level of consciousness: awake and alert Pain management: pain level controlled Vital Signs Assessment: post-procedure vital signs reviewed and stable Respiratory status: spontaneous breathing, nonlabored ventilation, respiratory function stable and patient connected to nasal cannula oxygen Cardiovascular status: blood pressure returned to baseline and stable Postop Assessment: no apparent nausea or vomiting Anesthetic complications: no   No notable events documented.  Last Vitals:  Vitals:   02/11/22 0237 02/11/22 0600  BP: 116/84 (!) 120/58  Pulse: 91 91  Resp: 18 17  Temp: 36.9 C 36.7 C  SpO2: 100% 99%    Last Pain:  Vitals:   02/11/22 0448  TempSrc:   PainSc: 0-No pain                 Shelton Silvas

## 2022-02-11 NOTE — Op Note (Signed)
02/10/2022 - 02/11/2022  1:20 AM  PATIENT:  Preston Mcgee  31 y.o. male  PRE-OPERATIVE DIAGNOSIS:  SMALL BOWEL PERFORATION  POST-OPERATIVE DIAGNOSIS:  Perforated Meckels Diverticulum with foreign body  PROCEDURE:  Procedure(s): Exploratory Laparotomy, Resection of Meckels Diverticulum with foreign body  SURGEON:  Surgeon(s): Gaynelle Adu, MD  ASSISTANTS: none   ANESTHESIA:   general  DRAINS: Urinary Catheter (Foley)   LOCAL MEDICATIONS USED:  NONE  SPECIMEN:  Source of Specimen:  meckels diverticulum with foreign body  DISPOSITION OF SPECIMEN:  PATHOLOGY  COUNTS:  YES  INDICATION FOR PROCEDURE: 31 year old Hispanic male who developed abdominal pain on Wednesday in his lower abdomen.  It was progressive so he went to urgent care and there was concerns for possible appendicitis so he was sent to the emergency room for evaluation.  A CT scan revealed a distal small bowel perforation due to what appeared to be a foreign object.  There is no free fluid or free air but surrounding mesenteric inflammation.  I recommended laparotomy with resection.  Risk and benefits were discussed and separately documented in his H&P  PROCEDURE: After obtaining informed consent the patient was brought to operating room 1 at [facility] and placed supine on the operating room table.  General endotracheal anesthesia was established.  Sequential compression devices were placed.  A Foley catheter was placed.  His abdomen was prepped and draped in the usual standard surgical fashion with ChloraPrep.  IV antibiotic had been administered in the ER.  A surgical timeout was performed.  A small lower midline incision was made sharply with a 10 blade.  Subcutaneous tissue was divided with electrocautery and the fascia was incised.  A wound protector was placed.  There is no free fluid or enteric contents.  I felt an indurated piece of bowel and was able to lift it up.  This appeared to be a Meckel's  diverticulum that had a foreign body protruding out through it.  The foreign body appeared to be perhaps part of a toothpick.  We identified the cecum.  The appendix was normal.  I started running back the small bowel proximally starting at the terminal ileum.  We came back across the distal ileum approximately 2 feet from the terminal ileum that had the Meckel's.  I then ran the bowel upstream.  There is no other abnormal pathology.  The surrounding small bowel was normal.  The surrounding small bowel mesentery was normal.  I felt that a simple diverticulectomy would be indicated.  There is no surrounding evidence of abnormal pathology.  It appeared the foreign object got stuck in his Meckel's and because the perforation.  Using an [employer] Echelon 60 mm powered stapler with a 60 mm white load I stapled the diverticulum off the small bowel ensuring that there was adequate lumen on the remaining small bowel so that it was not strictured.  The Meckel's was freed and passed off the field with the foreign object still penetrating it.  There is no evidence of bleeding along the staple line.  Again the lumen of the small bowel did not appear narrowed.  The bowel was returned to the abdomen.  I then irrigated the abdomen with 1 L of saline.  The wound protector was removed.  I then closed the abdomen with #1 PDS 1 from above and 1 from below and tied centrally.  Soft tissue was irrigated and the skin was closed with staples followed by the application of a honeycomb dressing.  All needle, instrument sponge counts were correct x 2.  There were no immediate complications.  The patient was extubated and taken to the recovery room in stable condition  PLAN OF CARE: Admit to inpatient   PATIENT DISPOSITION:  PACU - hemodynamically stable.   Delay start of Pharmacological VTE agent (>24hrs) due to surgical blood loss or risk of bleeding:  no   Mary Sella. Andrey Campanile, MD, FACS General, Bariatric, & Minimally Invasive  Surgery [facility], [place]

## 2022-02-11 NOTE — Progress Notes (Addendum)
1 Day Post-Op  Subjective: CC: Sore at abdominal incision.  Pain medications are helping.  Denies nausea or vomiting.  Taking small sips of clear liquids.  Has not been out of bed.  Foley still in place.  Denies alcohol or tobacco use.  Lives in [place].  Works as a Naval architect.  Objective: Vital signs in last 24 hours: Temp:  [98 F (36.7 C)-98.5 F (36.9 C)] 98 F (36.7 C) (12/09 0600) Pulse Rate:  [80-94] 91 (12/09 0600) Resp:  [16-25] 17 (12/09 0600) BP: (104-126)/(58-84) 120/58 (12/09 0600) SpO2:  [92 %-100 %] 99 % (12/09 0600)    Intake/Output from previous day: 12/08 0701 - 12/09 0700 In: 2200 [I.V.:1200; IV Piggyback:1000] Out: 250 [Urine:250] Intake/Output this shift: No intake/output data recorded.  PE: Gen:  Alert, NAD, pleasant Card:  Reg Pulm:  CTAB, no W/R/R, effort normal Abd: Soft, mild distension, appropriately tender around incision, no rigidity or guarding and otherwise NT, +BS. Lower midline wound with honeycomb dressing in place, cdi Ext:  No LE edema  Psych: A&Ox3   Lab Results:  Recent Labs    02/10/22 2009 02/10/22 2112 02/11/22 0352  WBC 10.7*  --  17.0*  HGB 15.2 14.6 14.0  HCT 42.5 43.0 39.3  PLT 276  --  254   BMET Recent Labs    02/10/22 2009 02/10/22 2112 02/11/22 0352  NA 139 139 138  K 4.2 4.0 3.7  CL 105 102 104  CO2 25  --  25  GLUCOSE 99 94 157*  BUN 15 13 12   CREATININE 1.10 1.00 1.16  CALCIUM 9.4  --  8.6*   PT/INR No results for input(s): "LABPROT", "INR" in the last 72 hours. CMP     Component Value Date/Time   NA 138 02/11/2022 0352   K 3.7 02/11/2022 0352   CL 104 02/11/2022 0352   CO2 25 02/11/2022 0352   GLUCOSE 157 (H) 02/11/2022 0352   BUN 12 02/11/2022 0352   CREATININE 1.16 02/11/2022 0352   CALCIUM 8.6 (L) 02/11/2022 0352   GFRNONAA >60 02/11/2022 0352   Lipase  No results found for: "LIPASE"  Studies/Results: CT ABDOMEN PELVIS W CONTRAST  Result Date: 02/10/2022 CLINICAL DATA:   Right lower quadrant abdominal pain EXAM: CT ABDOMEN AND PELVIS WITH CONTRAST TECHNIQUE: Multidetector CT imaging of the abdomen and pelvis was performed using the standard protocol following bolus administration of intravenous contrast. RADIATION DOSE REDUCTION: This exam was performed according to the departmental dose-optimization program which includes automated exposure control, adjustment of the mA and/or kV according to patient size and/or use of iterative reconstruction technique. CONTRAST:  81mL OMNIPAQUE IOHEXOL 350 MG/ML SOLN COMPARISON:  04/30/2019 FINDINGS: Lower chest: No acute abnormality. Hepatobiliary: No focal liver abnormality is seen. No gallstones, gallbladder wall thickening, or biliary dilatation. Pancreas: Unremarkable Spleen: Unremarkable Adrenals/Urinary Tract: Adrenal glands are unremarkable. Kidneys are normal, without renal calculi, focal lesion, or hydronephrosis. Bladder is unremarkable. Stomach/Bowel: A 2 cm wire like radiodense foreign body is seen within the mid small bowel within the right hypogastric region best seen on axial image # 64/3 and coronal image 40-47/6, possibly representing an ingested bone or wire bristle. The foreign body appears to penetrate the bowel wall and there is extensive adjacent mesenteric inflammatory stranding, best seen on image # 63/3. No loculated intra-abdominal fluid collections are identified. There is no free intraperitoneal gas or fluid. The stomach, small bowel, and large bowel are otherwise unremarkable. The appendix is normal. Vascular/Lymphatic: No  significant vascular findings are present. No enlarged abdominal or pelvic lymph nodes. Reproductive: Prostate is unremarkable. Other: No abdominal wall hernia Musculoskeletal: No acute or significant osseous findings. IMPRESSION: 1. 2 cm wire like radiodense foreign body within the mid small bowel within the right hypogastric region, possibly representing an ingested bone or wire bristle. The  foreign body appears to penetrate the bowel wall and there is extensive adjacent mesenteric inflammatory stranding. No loculated intra-abdominal fluid collections are identified. No free intraperitoneal gas or fluid. Electronically Signed   By: Fidela Salisbury M.D.   On: 02/10/2022 22:20    Anti-infectives: Anti-infectives (From admission, onward)    Start     Dose/Rate Route Frequency Ordered Stop   02/11/22 0800  piperacillin-tazobactam (ZOSYN) IVPB 3.375 g        3.375 g 12.5 mL/hr over 240 Minutes Intravenous Every 8 hours 02/11/22 0241 02/12/22 0759   02/10/22 2230  piperacillin-tazobactam (ZOSYN) IVPB 3.375 g        3.375 g 100 mL/hr over 30 Minutes Intravenous  Once 02/10/22 2228 02/10/22 2329        Assessment/Plan POD 0 s/p Exploratory Laparotomy, Resection of Meckels Diverticulum with foreign body for Perforated Meckels Diverticulum with foreign body by Dr. Redmond Pulling, 02/11/22 - Keep on CLD today.  - D/c Foley - Multimodal pain control - Pulm toilet - Mobilize  FEN - CLD, IVF  VTE - SCDs, Lovenox ID - Zosyn while here, does not need abx at d/c Foley - d/c today Plan - as above   LOS: 0 days    Jillyn Ledger , [facility] 02/11/2022, 8:31 AM Please see Amion for pager number during day hours 7:00am-4:30pm

## 2022-02-11 NOTE — Anesthesia Procedure Notes (Signed)
Procedure Name: Intubation Date/Time: 02/11/2022 12:16 AM  Performed by: Molli Hazard, CRNAPre-anesthesia Checklist: Patient identified, Emergency Drugs available, Suction available and Patient being monitored Patient Re-evaluated:Patient Re-evaluated prior to induction Oxygen Delivery Method: Circle system utilized Preoxygenation: Pre-oxygenation with 100% oxygen Induction Type: IV induction, Rapid sequence and Cricoid Pressure applied Laryngoscope Size: Miller and 2 Grade View: Grade I Tube type: Oral Tube size: 7.5 mm Number of attempts: 1 Airway Equipment and Method: Stylet Placement Confirmation: ETT inserted through vocal cords under direct vision, positive ETCO2 and breath sounds checked- equal and bilateral Secured at: 22 cm Tube secured with: Tape Dental Injury: Teeth and Oropharynx as per pre-operative assessment

## 2022-02-12 ENCOUNTER — Encounter (HOSPITAL_COMMUNITY): Payer: Self-pay | Admitting: General Surgery

## 2022-02-12 MED ORDER — TRAMADOL HCL 50 MG PO TABS
50.0000 mg | ORAL_TABLET | Freq: Four times a day (QID) | ORAL | Status: DC | PRN
  Administered 2022-02-12: 100 mg via ORAL
  Filled 2022-02-12: qty 2

## 2022-02-12 MED ORDER — MORPHINE SULFATE (PF) 2 MG/ML IV SOLN: 2.0000 mg | INTRAVENOUS | Status: DC | PRN

## 2022-02-12 MED ORDER — METHOCARBAMOL 750 MG PO TABS: 750.0000 mg | ORAL_TABLET | Freq: Four times a day (QID) | ORAL | Status: DC | PRN

## 2022-02-12 MED ORDER — METHOCARBAMOL 750 MG PO TABS
750.0000 mg | ORAL_TABLET | Freq: Three times a day (TID) | ORAL | Status: DC
  Administered 2022-02-12 – 2022-02-13 (×4): 750 mg via ORAL
  Filled 2022-02-12 (×4): qty 1

## 2022-02-12 NOTE — Plan of Care (Signed)

## 2022-02-12 NOTE — Progress Notes (Addendum)
2 Days Post-Op  Subjective: CC: Sore at abdominal incision. Overall he feels his pain is well controlled with non-narcotic pain medications. Has not required narcotic pain medication since yesterday morning. Reports Oxycodone didn't help with pain when he took it but just made him sleepy. Discussed continuing to maximize non-narcotic pain medications and changing to different medication such as ultram. He has not tried ultram in the past. Denies being on SSRI/SNRI or any daily medication.   Tolerating cld. Denies nausea or vomiting. Passing flatus. No bm yet. Foley out. Voiding. Mobilizing in halls.   Objective: Vital signs in last 24 hours: Temp:  [98.2 F (36.8 C)-98.5 F (36.9 C)] 98.3 F (36.8 C) (12/10 0450) Pulse Rate:  [87-92] 87 (12/10 0450) Resp:  [15-18] 17 (12/10 0450) BP: (106-126)/(73-75) 126/75 (12/10 0450) SpO2:  [96 %-98 %] 98 % (12/10 0450) Last BM Date : 02/10/22  Intake/Output from previous day: 12/09 0701 - 12/10 0700 In: 904.7 [P.O.:480; I.V.:371.5; IV Piggyback:53.2] Out: 310 [Urine:310] Intake/Output this shift: No intake/output data recorded.  PE: Gen:  Alert, NAD, pleasant Card:  Reg Pulm:  CTAB, no W/R/R, effort normal Abd: Soft, mild distension, appropriately tender around incision, no rigidity or guarding and otherwise NT, +BS. Lower midline wound with honeycomb dressing in place, cdi Ext:  No LE edema  Psych: A&Ox3   Lab Results:  Recent Labs    02/10/22 2009 02/10/22 2112 02/11/22 0352  WBC 10.7*  --  17.0*  HGB 15.2 14.6 14.0  HCT 42.5 43.0 39.3  PLT 276  --  254    BMET Recent Labs    02/10/22 2009 02/10/22 2112 02/11/22 0352  NA 139 139 138  K 4.2 4.0 3.7  CL 105 102 104  CO2 25  --  25  GLUCOSE 99 94 157*  BUN 15 13 12   CREATININE 1.10 1.00 1.16  CALCIUM 9.4  --  8.6*    PT/INR No results for input(s): "LABPROT", "INR" in the last 72 hours. CMP     Component Value Date/Time   NA 138 02/11/2022 0352   K 3.7  02/11/2022 0352   CL 104 02/11/2022 0352   CO2 25 02/11/2022 0352   GLUCOSE 157 (H) 02/11/2022 0352   BUN 12 02/11/2022 0352   CREATININE 1.16 02/11/2022 0352   CALCIUM 8.6 (L) 02/11/2022 0352   GFRNONAA >60 02/11/2022 0352   Lipase  No results found for: "LIPASE"  Studies/Results: CT ABDOMEN PELVIS W CONTRAST  Result Date: 02/10/2022 CLINICAL DATA:  Right lower quadrant abdominal pain EXAM: CT ABDOMEN AND PELVIS WITH CONTRAST TECHNIQUE: Multidetector CT imaging of the abdomen and pelvis was performed using the standard protocol following bolus administration of intravenous contrast. RADIATION DOSE REDUCTION: This exam was performed according to the departmental dose-optimization program which includes automated exposure control, adjustment of the mA and/or kV according to patient size and/or use of iterative reconstruction technique. CONTRAST:  70mL OMNIPAQUE IOHEXOL 350 MG/ML SOLN COMPARISON:  04/30/2019 FINDINGS: Lower chest: No acute abnormality. Hepatobiliary: No focal liver abnormality is seen. No gallstones, gallbladder wall thickening, or biliary dilatation. Pancreas: Unremarkable Spleen: Unremarkable Adrenals/Urinary Tract: Adrenal glands are unremarkable. Kidneys are normal, without renal calculi, focal lesion, or hydronephrosis. Bladder is unremarkable. Stomach/Bowel: A 2 cm wire like radiodense foreign body is seen within the mid small bowel within the right hypogastric region best seen on axial image # 64/3 and coronal image 40-47/6, possibly representing an ingested bone or wire bristle. The foreign body appears to penetrate  the bowel wall and there is extensive adjacent mesenteric inflammatory stranding, best seen on image # 63/3. No loculated intra-abdominal fluid collections are identified. There is no free intraperitoneal gas or fluid. The stomach, small bowel, and large bowel are otherwise unremarkable. The appendix is normal. Vascular/Lymphatic: No significant vascular findings are  present. No enlarged abdominal or pelvic lymph nodes. Reproductive: Prostate is unremarkable. Other: No abdominal wall hernia Musculoskeletal: No acute or significant osseous findings. IMPRESSION: 1. 2 cm wire like radiodense foreign body within the mid small bowel within the right hypogastric region, possibly representing an ingested bone or wire bristle. The foreign body appears to penetrate the bowel wall and there is extensive adjacent mesenteric inflammatory stranding. No loculated intra-abdominal fluid collections are identified. No free intraperitoneal gas or fluid. Electronically Signed   By: Fidela Salisbury M.D.   On: 02/10/2022 22:20    Anti-infectives: Anti-infectives (From admission, onward)    Start     Dose/Rate Route Frequency Ordered Stop   02/11/22 0800  piperacillin-tazobactam (ZOSYN) IVPB 3.375 g        3.375 g 12.5 mL/hr over 240 Minutes Intravenous Every 8 hours 02/11/22 0241 02/12/22 0420   02/10/22 2230  piperacillin-tazobactam (ZOSYN) IVPB 3.375 g        3.375 g 100 mL/hr over 30 Minutes Intravenous  Once 02/10/22 2228 02/10/22 2329        Assessment/Plan POD 1 s/p Exploratory Laparotomy, Resection of Meckels Diverticulum with foreign body for Perforated Meckels Diverticulum with foreign body by Dr. Redmond Pulling, 02/11/22 - Adv to FLD overnight. Has not tried this. Will ADAT today.  - Multimodal pain control  - Pulm toilet - Mobilize  FEN - FLD, ADAT, d/c IVF  VTE - SCDs, Lovenox ID - Zosyn x 24 hours post op. None currently. WBC 17, afebrile.  Foley - out, voiding.  Plan - Adv diet.    LOS: 1 day    Jillyn Ledger , [facility] 02/12/2022, 8:24 AM Please see Amion for pager number during day hours 7:00am-4:30pm

## 2022-02-13 ENCOUNTER — Other Ambulatory Visit (HOSPITAL_COMMUNITY): Payer: Self-pay

## 2022-02-13 LAB — CBC
HCT: 39.4 % (ref 39.0–52.0)
Hemoglobin: 13.8 g/dL (ref 13.0–17.0)
MCH: 30.6 pg (ref 26.0–34.0)
MCHC: 35 g/dL (ref 30.0–36.0)
MCV: 87.4 fL (ref 80.0–100.0)
Platelets: 263 10*3/uL (ref 150–400)
RBC: 4.51 MIL/uL (ref 4.22–5.81)
RDW: 12.2 % (ref 11.5–15.5)
WBC: 9.2 10*3/uL (ref 4.0–10.5)
nRBC: 0 % (ref 0.0–0.2)

## 2022-02-13 MED ORDER — ACETAMINOPHEN 500 MG PO TABS: 1000.0000 mg | ORAL_TABLET | Freq: Three times a day (TID) | ORAL | 0 refills | Status: AC | PRN

## 2022-02-13 MED ORDER — METHOCARBAMOL 750 MG PO TABS
750.0000 mg | ORAL_TABLET | Freq: Three times a day (TID) | ORAL | 0 refills | Status: AC | PRN
  Filled 2022-02-13: qty 45, 15d supply, fill #0

## 2022-02-13 MED ORDER — TRAMADOL HCL 50 MG PO TABS
50.0000 mg | ORAL_TABLET | Freq: Four times a day (QID) | ORAL | 0 refills | Status: AC | PRN
  Filled 2022-02-13: qty 15, 4d supply, fill #0

## 2022-02-13 NOTE — Plan of Care (Signed)

## 2022-02-13 NOTE — Plan of Care (Signed)
  Problem: Education: Goal: Knowledge of General Education information will improve Description: Including pain rating scale, medication(s)/side effects and non-pharmacologic comfort measures 02/13/2022 0932 by Flossie Dibble, RN Outcome: Completed/Met 02/13/2022 0828 by Flossie Dibble, RN Outcome: Progressing   Problem: Health Behavior/Discharge Planning: Goal: Ability to manage health-related needs will improve 02/13/2022 0932 by Flossie Dibble, RN Outcome: Completed/Met 02/13/2022 0828 by Flossie Dibble, RN Outcome: Progressing   Problem: Clinical Measurements: Goal: Ability to maintain clinical measurements within normal limits will improve 02/13/2022 0932 by Flossie Dibble, RN Outcome: Completed/Met 02/13/2022 0828 by Flossie Dibble, RN Outcome: Progressing Goal: Will remain free from infection 02/13/2022 0932 by Flossie Dibble, RN Outcome: Completed/Met 02/13/2022 0828 by Flossie Dibble, RN Outcome: Progressing Goal: Diagnostic test results will improve 02/13/2022 0932 by Flossie Dibble, RN Outcome: Completed/Met 02/13/2022 0828 by Flossie Dibble, RN Outcome: Progressing Goal: Respiratory complications will improve 02/13/2022 0932 by Flossie Dibble, RN Outcome: Completed/Met 02/13/2022 0828 by Flossie Dibble, RN Outcome: Progressing Goal: Cardiovascular complication will be avoided 02/13/2022 0932 by Flossie Dibble, RN Outcome: Completed/Met 02/13/2022 0828 by Flossie Dibble, RN Outcome: Progressing   Problem: Activity: Goal: Risk for activity intolerance will decrease 02/13/2022 0932 by Flossie Dibble, RN Outcome: Completed/Met 02/13/2022 0828 by Flossie Dibble, RN Outcome: Progressing   Problem: Nutrition: Goal: Adequate nutrition will be maintained 02/13/2022 0932 by Flossie Dibble, RN Outcome: Completed/Met 02/13/2022 0828 by Flossie Dibble, RN Outcome: Progressing   Problem: Coping: Goal: Level of anxiety will decrease 02/13/2022 0932 by Flossie Dibble, RN Outcome: Completed/Met 02/13/2022 0828 by Flossie Dibble, RN Outcome: Progressing   Problem: Elimination: Goal: Will not experience complications related to bowel motility 02/13/2022 0932 by Flossie Dibble, RN Outcome: Completed/Met 02/13/2022 0828 by Flossie Dibble, RN Outcome: Progressing Goal: Will not experience complications related to urinary retention 02/13/2022 0932 by Flossie Dibble, RN Outcome: Completed/Met 02/13/2022 0828 by Flossie Dibble, RN Outcome: Progressing   Problem: Pain Managment: Goal: General experience of comfort will improve 02/13/2022 0932 by Flossie Dibble, RN Outcome: Completed/Met 02/13/2022 0828 by Flossie Dibble, RN Outcome: Progressing   Problem: Safety: Goal: Ability to remain free from injury will improve 02/13/2022 0932 by Flossie Dibble, RN Outcome: Completed/Met 02/13/2022 0828 by Flossie Dibble, RN Outcome: Progressing   Problem: Skin Integrity: Goal: Risk for impaired skin integrity will decrease 02/13/2022 0932 by Flossie Dibble, RN Outcome: Completed/Met 02/13/2022 0828 by Flossie Dibble, RN Outcome: Progressing

## 2022-02-13 NOTE — Discharge Instructions (Signed)
[facility]      [facility] [phone]  OPEN ABDOMINAL SURGERY: POST OP INSTRUCTIONS  Always review your discharge instruction sheet given to you by the facility where your surgery was performed.  IF YOU HAVE DISABILITY OR FAMILY LEAVE FORMS, YOU MUST BRING THEM TO THE OFFICE FOR PROCESSING.  PLEASE DO NOT GIVE THEM TO YOUR DOCTOR.  A prescription for pain medication may be given to you upon discharge.  Take your pain medication as prescribed, if needed.  If narcotic pain medicine is not needed, then you may take acetaminophen (Tylenol) or ibuprofen (Advil) as needed. Take your usually prescribed medications unless otherwise directed. If you need a refill on your pain medication, please contact your pharmacy. They will contact our office to request authorization.  Prescriptions will not be filled after 5pm or on week-ends. You should follow a light diet the first few days after arrival home, such as soup and crackers, pudding, etc.unless your doctor has advised otherwise. A high-fiber, low fat diet can be resumed as tolerated.   Be sure to include lots of fluids daily. Most patients will experience some swelling and bruising on the chest and neck area.  Ice packs will help.  Swelling and bruising can take several days to resolve Most patients will experience some swelling and bruising in the area of the incision. Ice pack will help. Swelling and bruising can take several days to resolve..  It is common to experience some constipation if taking pain medication after surgery.  Increasing fluid intake and taking a stool softener will usually help or prevent this problem from occurring.  A mild laxative (Milk of Magnesia or Miralax) should be taken according to package directions if there are no bowel movements after 48 hours.  You may have steri-strips (small skin tapes) in place directly over the incision.  These strips should be left on the skin for 7-10 days.  If your surgeon used skin  glue on the incision, you may shower in 24 hours.  The glue will flake off over the next 2-3 weeks.  Any sutures or staples will be removed at the office during your follow-up visit. You may find that a light gauze bandage over your incision may keep your staples from being rubbed or pulled. You may shower and replace the bandage daily. ACTIVITIES:  You may resume regular (light) daily activities beginning the next day--such as daily self-care, walking, climbing stairs--gradually increasing activities as tolerated.  You may have sexual intercourse when it is comfortable.  Refrain from any heavy lifting or straining until approved by your doctor. You may drive when you no longer are taking prescription pain medication, you can comfortably wear a seatbelt, and you can safely maneuver your car and apply brakes Return to Work: ___________________________________ You should see your doctor in the office for a follow-up appointment approximately two weeks after your surgery.  Make sure that you call for this appointment within a day or two after you arrive home to insure a convenient appointment time. OTHER INSTRUCTIONS:  _____________________________________________________________ _____________________________________________________________  WHEN TO CALL YOUR DOCTOR: Fever over 101.0 Inability to urinate Nausea and/or vomiting Extreme swelling or bruising Continued bleeding from incision. Increased pain, redness, or drainage from the incision. Difficulty swallowing or breathing Muscle cramping or spasms. Numbness or tingling in hands or feet or around lips.  The clinic staff is available to answer your questions during regular business hours.  Please don't hesitate to call and ask to speak to one of   the nurses if you have concerns.  For further questions, please visit [email or web address]  

## 2022-02-13 NOTE — Discharge Summary (Signed)
Patient ID: Chapman Matteucci [id number] [birth year] 31 y.o.  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 Andrey Campanile, 02/11/22   Consultants None  Reason for Admission: Tuff Clabo is an 31 y.o. male who is here for evaluation for possible appendicitis.  He presented to a walk-in clinic complaining lower abdominal pain more so on the right side since Wednesday morning.  He was able to go to work that day but later in the day his pain worsened significantly and he developed a fever.  His wife gave him some medicine and the fever improved but he still continued to have abdominal pain.  No nausea or vomiting.  No diarrhea or constipation.  Last bowel movement was today.  He went to urgent care with complaints of right lower quadrant pain and was sent here for further evaluation for possible appendicitis.  Imaging here revealed a foreign object penetrating the small bowel wall with localized inflammation consistent with small bowel perforation contained.  Denies any prior surgery.  He states that he had some right lower quadrant pain about a year and a half ago and had imaging and saw a few surgeons and he is wondering if this could be a sequela of that.  No weight loss.  No melena or hematochezia.  He is a [employer].  He cannot remember ingesting anything abnormal.   He denies any daily medications.  He denies any tobacco, alcohol or drug use.  Procedures Dr. 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 Andrey Campanile on 12/9. Diet was advanced and tolerated. He remained on abx x 24 hours post op. WBC normalized and he  was afebrile prior to discharge. On POD2, the patient was voiding well, tolerating diet, ambulating well, pain well controlled, vital signs stable, incisions c/d/i and felt stable for discharge home. Discussed discharge instructions, restrictions and return/call back precautions.  Physical Exam: Gen:  Alert, NAD, pleasant Card:  Reg Pulm:  CTAB, no W/R/R, effort normal Abd: Soft, ND, appropriately tender around incision, no rigidity or guarding and otherwise NT, +BS. Lower midline wound with honeycomb dressing in place, cdi Psych: A&Ox3   Allergies as of 02/13/2022   No Known Allergies      Medication List     STOP taking these medications    cephALEXin 500 MG capsule Commonly known as: KEFLEX       TAKE these medications    acetaminophen 500 MG tablet Commonly known as: TYLENOL Take 2 tablets (1,000 mg total) by mouth every 8 (eight) hours as needed for mild pain.   methocarbamol 750 MG tablet Commonly known as: ROBAXIN Take 1 tablet (750 mg total) by mouth every 8 (eight) hours as needed for muscle spasms.   traMADol 50 MG tablet Commonly known as: ULTRAM Take 1 tablet (50 mg total) by mouth every 6 (six) hours as needed (breakthrough pain).          Follow-up Information     [facility], [facility]. Go on 02/24/2022.   Specialty: General Surgery Why: 10am. This is a nurse visit for staple removal. Please bring a copy of your photo ID and insurance card. Please arrive 30 minutes prior to your appointment for paperwork. Contact information: [address] [place] [address] [phone]         Sur[facility], [place]. Go on 03/14/2022.   Specialty: General Surgery Why: 830am. Please bring a copy of your photo ID and insurance card. Please arrive 30 minutes prior to your appointment for paperwork. Contact information: [address] [place] [address] [phone]                 Signed: Leary Roca,  [facility] 02/13/2022, 8:58 AM Please see Amion for pager number during day hours 7:00am-4:30pm

## 2022-02-14 LAB — SURGICAL PATHOLOGY, GROSS ONLY (NOT ARMC)

## 2022-09-18 IMAGING — DX DG HAND 2V*L*
2 series · 2 of 2 positions shown · non-contrast
Comparison: [DATE] [DATE], [DATE] ([DATE] p.m.)

CLINICAL DATA: Evaluate foreign body.

EXAM:
LEFT HAND - 2 VIEW

[hand ap]
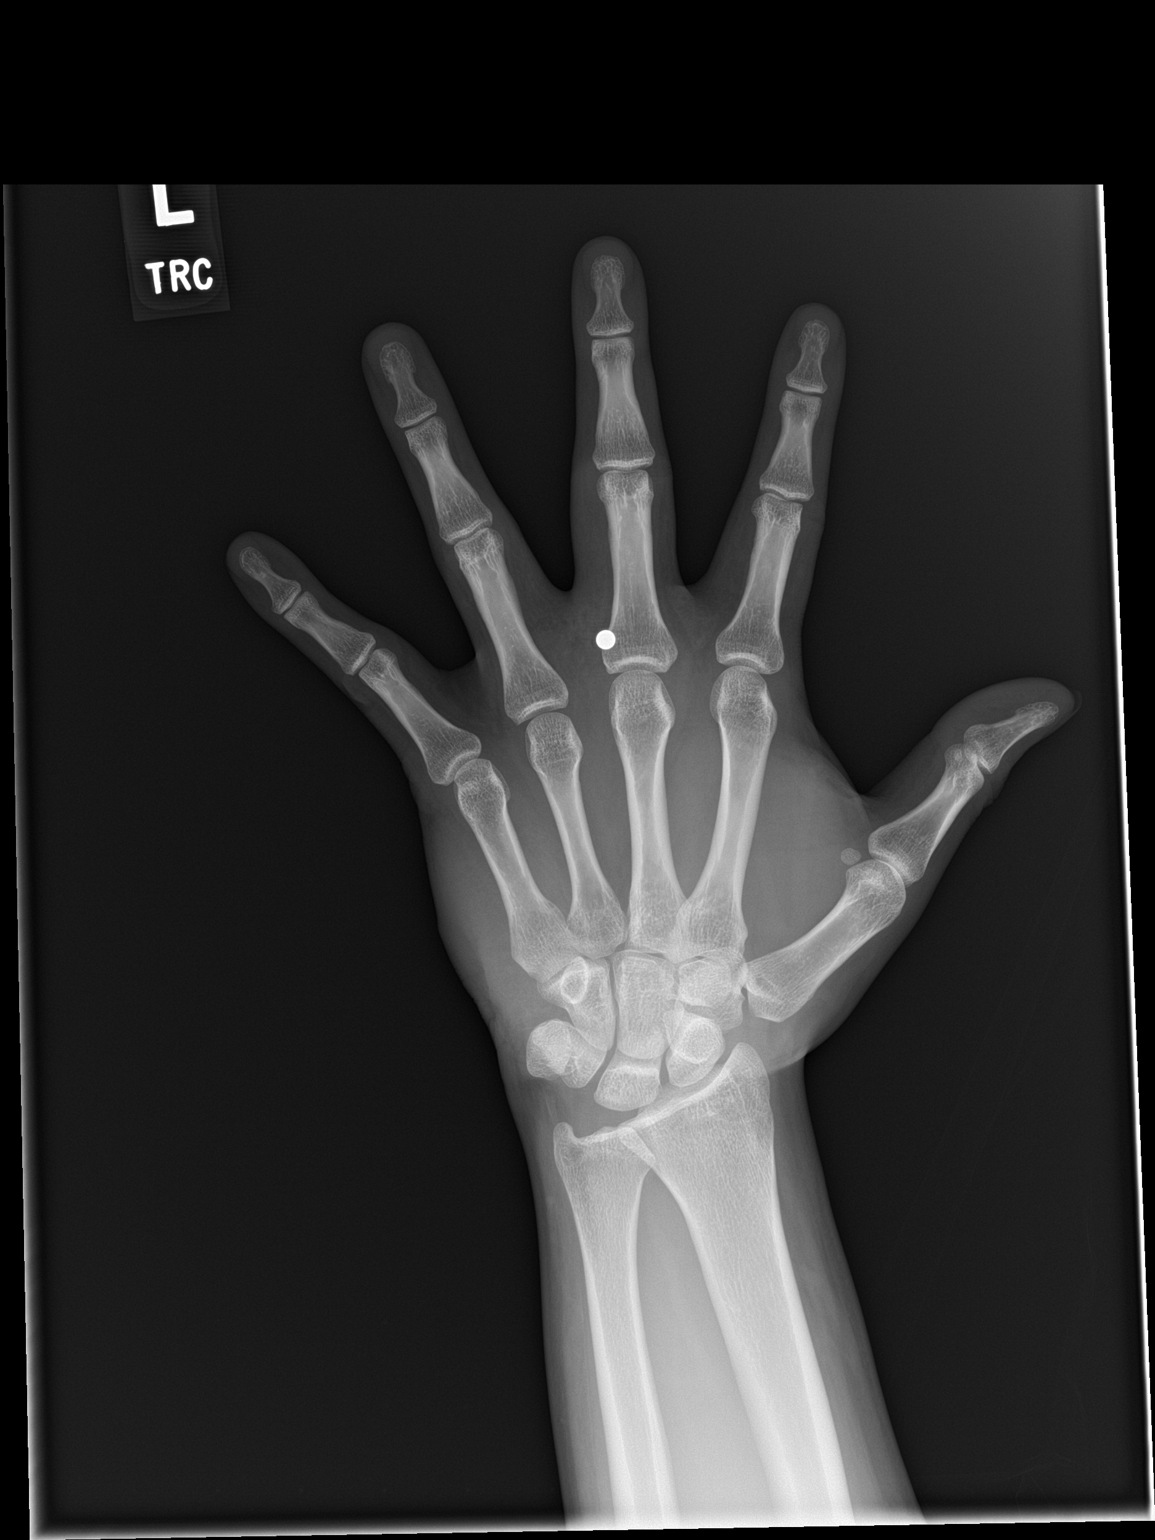

[hand lat]
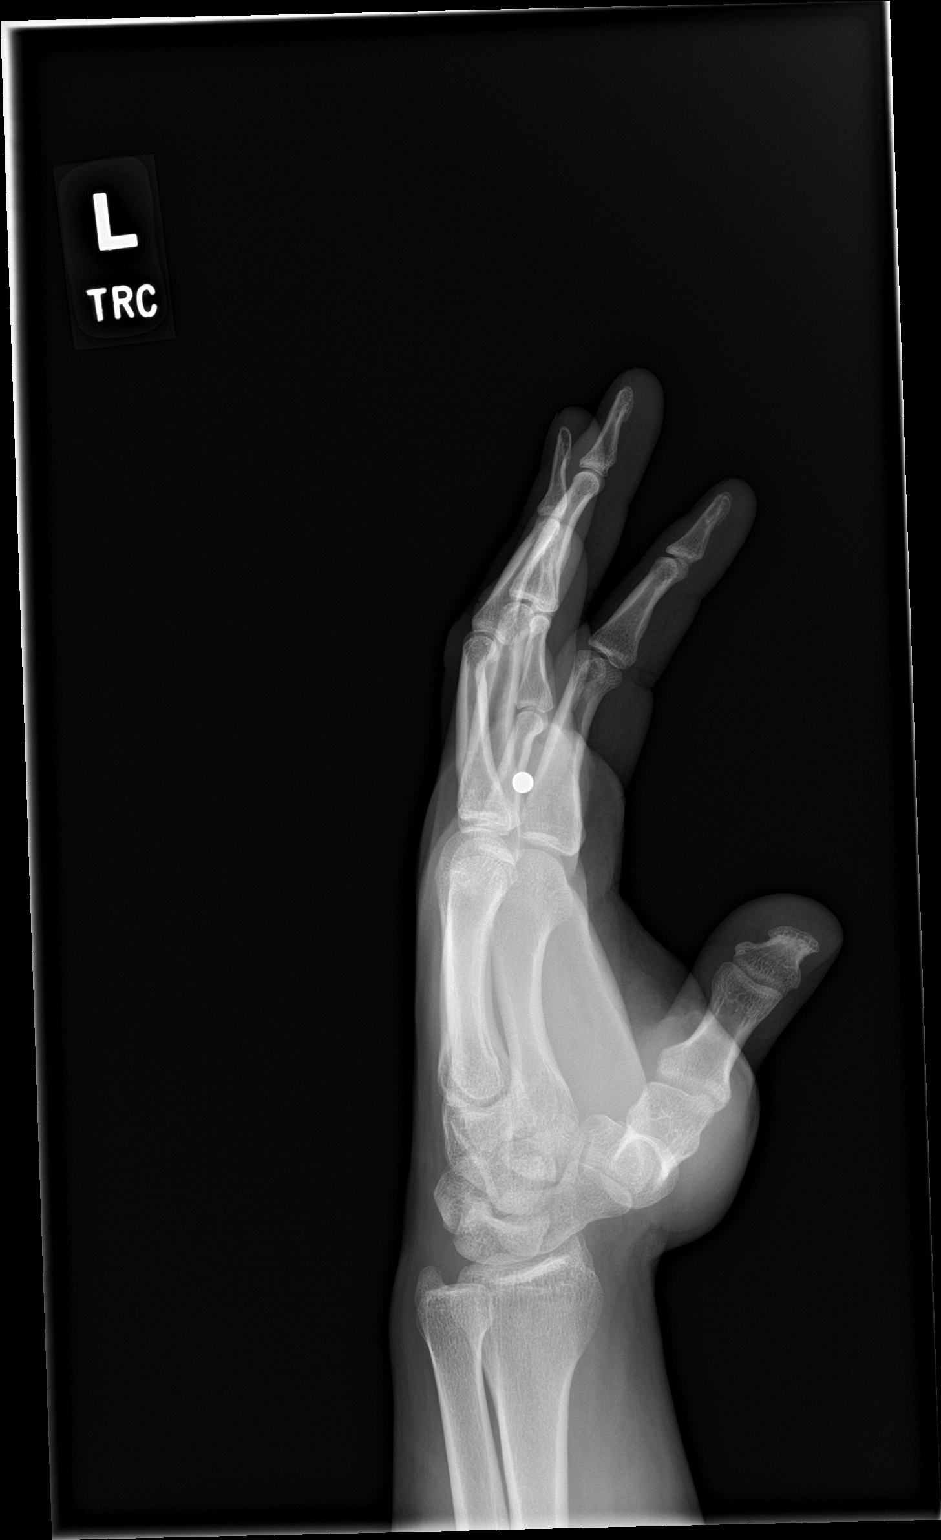

[2 of 2 positions shown; findings below may reference images not displayed]

FINDINGS: There is no evidence of fracture or dislocation. There is no
evidence of arthropathy or other focal bone abnormality. A round
radiopaque BB is again seen within the soft tissues along the volar
aspect of the proximal phalanx of the third left finger. This is
seen just distal to the third left metacarpophalangeal joint and is
stable in position when compared to the prior study.
IMPRESSION: Stable radiopaque BB within the soft tissues along the volar aspect
of the proximal phalanx of the third left finger.

## 2023-07-23 ENCOUNTER — Other Ambulatory Visit: Payer: Self-pay | Admitting: Urology

## 2023-07-23 DIAGNOSIS — N50811 Right testicular pain: Secondary | ICD-10-CM

## 2023-08-24 ENCOUNTER — Other Ambulatory Visit: Payer: Self-pay

## 2023-08-30 ENCOUNTER — Ambulatory Visit
Admission: RE | Admit: 2023-08-30 | Discharge: 2023-08-30 | Disposition: A | Discharge status: Home / Self Care | Payer: Self-pay | Source: Ambulatory Visit | Attending: Urology | Admitting: Urology

## 2023-08-30 DIAGNOSIS — N50811 Right testicular pain: Secondary | ICD-10-CM
